# Patient Record
Sex: Female | Born: 1935 | Race: White | Hispanic: No
Health system: Southern US, Community
[De-identification: ages and names within clinical notes are randomized; demographics above are authoritative.]

## PROBLEM LIST (undated history)

## (undated) DIAGNOSIS — F0390 Unspecified dementia without behavioral disturbance: Secondary | ICD-10-CM

## (undated) DIAGNOSIS — E039 Hypothyroidism, unspecified: Secondary | ICD-10-CM

## (undated) DIAGNOSIS — I10 Essential (primary) hypertension: Secondary | ICD-10-CM

## (undated) DIAGNOSIS — E876 Hypokalemia: Secondary | ICD-10-CM

---

## 2006-09-05 ENCOUNTER — Ambulatory Visit: Payer: Self-pay | Admitting: Internal Medicine

## 2007-10-07 ENCOUNTER — Ambulatory Visit: Payer: Self-pay | Admitting: Internal Medicine

## 2009-09-19 ENCOUNTER — Emergency Department: Payer: Self-pay | Admitting: Unknown Physician Specialty

## 2011-06-15 LAB — COMPREHENSIVE METABOLIC PANEL
Alkaline Phosphatase: 86 U/L (ref 50–136)
BUN: 17 mg/dL (ref 7–18)
Bilirubin,Total: 0.4 mg/dL (ref 0.2–1.0)
Calcium, Total: 8.8 mg/dL (ref 8.5–10.1)
Co2: 25 mmol/L (ref 21–32)
Creatinine: 1.11 mg/dL (ref 0.60–1.30)
Glucose: 105 mg/dL — ABNORMAL HIGH (ref 65–99)
SGPT (ALT): 19 U/L
Sodium: 141 mmol/L (ref 136–145)
Total Protein: 8.1 g/dL (ref 6.4–8.2)

## 2011-06-15 LAB — ETHANOL
Ethanol %: <0.003 % (ref 0.000–0.080)
Ethanol: <3 mg/dL

## 2011-06-15 LAB — URINALYSIS, COMPLETE
Bacteria: NONE SEEN
Blood: NEGATIVE
Ketone: NEGATIVE
Nitrite: NEGATIVE
Ph: 6 (ref 4.5–8.0)
Protein: 30
Specific Gravity: 1.01 (ref 1.003–1.030)
WBC UR: 1 /HPF (ref 0–5)

## 2011-06-15 LAB — CBC
HCT: 36.3 % (ref 35.0–47.0)
HGB: 12.3 g/dL (ref 12.0–16.0)
MCH: 32.2 pg (ref 26.0–34.0)
MCV: 95 fL (ref 80–100)
RBC: 3.82 10*6/uL (ref 3.80–5.20)
WBC: 9.2 10*3/uL (ref 3.6–11.0)

## 2011-06-15 LAB — SALICYLATE LEVEL: Salicylates, Serum: <1.7 mg/dL

## 2011-06-15 LAB — CK TOTAL AND CKMB (NOT AT ARMC): CK, Total: 242 U/L — ABNORMAL HIGH (ref 21–215)

## 2011-06-15 LAB — ACETAMINOPHEN LEVEL: Acetaminophen: <2 ug/mL

## 2011-06-16 LAB — CK TOTAL AND CKMB (NOT AT ARMC): CK, Total: 276 U/L — ABNORMAL HIGH (ref 21–215)

## 2011-06-16 LAB — TROPONIN I
Troponin-I: 0.04 ng/mL
Troponin-I: 0.05 ng/mL

## 2011-06-17 LAB — CBC WITH DIFFERENTIAL/PLATELET
Basophil #: 0 10*3/uL (ref 0.0–0.1)
Basophil %: 0.9 %
Eosinophil #: 0.2 10*3/uL (ref 0.0–0.7)
Eosinophil %: 3.3 %
HCT: 32.5 % — ABNORMAL LOW (ref 35.0–47.0)
HGB: 10.9 g/dL — ABNORMAL LOW (ref 12.0–16.0)
Lymphocyte #: 1.2 10*3/uL (ref 1.0–3.6)
Lymphocyte %: 23.3 %
MCH: 32.3 pg (ref 26.0–34.0)
MCHC: 33.7 g/dL (ref 32.0–36.0)
MCV: 96 fL (ref 80–100)
Monocyte #: 0.4 10*3/uL (ref 0.0–0.7)
Monocyte %: 8.2 %
Neutrophil #: 3.4 10*3/uL (ref 1.4–6.5)
Neutrophil %: 64.3 %
Platelet: 119 10*3/uL — ABNORMAL LOW (ref 150–440)
RBC: 3.39 10*6/uL — ABNORMAL LOW (ref 3.80–5.20)
RDW: 14.3 % (ref 11.5–14.5)
WBC: 5.2 10*3/uL (ref 3.6–11.0)

## 2011-06-17 LAB — BASIC METABOLIC PANEL
Anion Gap: 12 (ref 7–16)
BUN: 27 mg/dL — ABNORMAL HIGH (ref 7–18)
Calcium, Total: 8.7 mg/dL (ref 8.5–10.1)
Chloride: 104 mmol/L (ref 98–107)
Co2: 25 mmol/L (ref 21–32)
Creatinine: 1.36 mg/dL — ABNORMAL HIGH (ref 0.60–1.30)
EGFR (African American): 47 — ABNORMAL LOW
EGFR (Non-African Amer.): 39 — ABNORMAL LOW
Glucose: 137 mg/dL — ABNORMAL HIGH (ref 65–99)
Osmolality: 289 (ref 275–301)
Potassium: 3.5 mmol/L (ref 3.5–5.1)
Sodium: 141 mmol/L (ref 136–145)

## 2011-06-17 LAB — URINE CULTURE

## 2011-06-18 ENCOUNTER — Inpatient Hospital Stay: Payer: Self-pay | Admitting: Internal Medicine

## 2011-06-19 LAB — BASIC METABOLIC PANEL
Anion Gap: 14 (ref 7–16)
Calcium, Total: 8.7 mg/dL (ref 8.5–10.1)
Chloride: 103 mmol/L (ref 98–107)
Co2: 25 mmol/L (ref 21–32)
Creatinine: 1.48 mg/dL — ABNORMAL HIGH (ref 0.60–1.30)
EGFR (African American): 43 — ABNORMAL LOW
Glucose: 102 mg/dL — ABNORMAL HIGH (ref 65–99)
Osmolality: 291 (ref 275–301)

## 2011-06-20 LAB — BASIC METABOLIC PANEL
BUN: 22 mg/dL — ABNORMAL HIGH (ref 7–18)
Calcium, Total: 8.2 mg/dL — ABNORMAL LOW (ref 8.5–10.1)
Chloride: 107 mmol/L (ref 98–107)
Creatinine: 1.12 mg/dL (ref 0.60–1.30)
EGFR (Non-African Amer.): 49 — ABNORMAL LOW
Glucose: 147 mg/dL — ABNORMAL HIGH (ref 65–99)
Potassium: 3.4 mmol/L — ABNORMAL LOW (ref 3.5–5.1)
Sodium: 139 mmol/L (ref 136–145)

## 2011-06-21 LAB — BASIC METABOLIC PANEL
BUN: 16 mg/dL (ref 7–18)
Calcium, Total: 8.9 mg/dL (ref 8.5–10.1)
EGFR (African American): >60
EGFR (Non-African Amer.): 53 — ABNORMAL LOW
Glucose: 97 mg/dL (ref 65–99)
Osmolality: 288 (ref 275–301)
Potassium: 3.6 mmol/L (ref 3.5–5.1)
Sodium: 144 mmol/L (ref 136–145)

## 2011-11-18 LAB — URINALYSIS, COMPLETE
Hyaline Cast: 3
Nitrite: NEGATIVE
Ph: 7 (ref 4.5–8.0)
Protein: NEGATIVE
Specific Gravity: 1.012 (ref 1.003–1.030)

## 2011-11-18 LAB — COMPREHENSIVE METABOLIC PANEL
Albumin: 3.3 g/dL — ABNORMAL LOW (ref 3.4–5.0)
Alkaline Phosphatase: 120 U/L (ref 50–136)
Anion Gap: 11 (ref 7–16)
BUN: 21 mg/dL — ABNORMAL HIGH (ref 7–18)
Calcium, Total: 9.4 mg/dL (ref 8.5–10.1)
Glucose: 144 mg/dL — ABNORMAL HIGH (ref 65–99)
Osmolality: 268 (ref 275–301)
SGOT(AST): 33 U/L (ref 15–37)
SGPT (ALT): 21 U/L
Sodium: 131 mmol/L — ABNORMAL LOW (ref 136–145)
Total Protein: 7.6 g/dL (ref 6.4–8.2)

## 2011-11-18 LAB — CBC WITH DIFFERENTIAL/PLATELET
Basophil %: 0.3 %
Eosinophil %: 0.4 %
HGB: 11.7 g/dL — ABNORMAL LOW (ref 12.0–16.0)
Lymphocyte %: 8.3 %
MCH: 31.2 pg (ref 26.0–34.0)
Monocyte #: 0.5 x10 3/mm (ref 0.2–0.9)
Monocyte %: 4.4 %
Neutrophil %: 86.6 %
RBC: 3.77 10*6/uL — ABNORMAL LOW (ref 3.80–5.20)
WBC: 10.8 10*3/uL (ref 3.6–11.0)

## 2011-11-19 ENCOUNTER — Inpatient Hospital Stay: Payer: Self-pay | Admitting: Specialist

## 2011-11-19 LAB — BASIC METABOLIC PANEL
BUN: 19 mg/dL — ABNORMAL HIGH (ref 7–18)
Calcium, Total: 9 mg/dL (ref 8.5–10.1)
Chloride: 99 mmol/L (ref 98–107)
Co2: 25 mmol/L (ref 21–32)
EGFR (Non-African Amer.): 45 — ABNORMAL LOW
Glucose: 111 mg/dL — ABNORMAL HIGH (ref 65–99)
Osmolality: 273 (ref 275–301)
Potassium: 3.4 mmol/L — ABNORMAL LOW (ref 3.5–5.1)
Sodium: 135 mmol/L — ABNORMAL LOW (ref 136–145)

## 2011-11-19 LAB — URIC ACID: Uric Acid: 7.1 mg/dL — ABNORMAL HIGH (ref 2.6–6.0)

## 2011-11-19 LAB — OSMOLALITY, URINE: Osmolality: 283 mOsm/kg

## 2011-11-20 LAB — CBC WITH DIFFERENTIAL/PLATELET
Basophil %: 0.3 %
Eosinophil %: 0.3 %
HCT: 24.9 % — ABNORMAL LOW (ref 35.0–47.0)
HGB: 8.7 g/dL — ABNORMAL LOW (ref 12.0–16.0)
Lymphocyte #: 0.8 10*3/uL — ABNORMAL LOW (ref 1.0–3.6)
Lymphocyte %: 12.5 %
MCH: 31.2 pg (ref 26.0–34.0)
MCV: 89 fL (ref 80–100)
Monocyte %: 10.3 %
Platelet: 199 10*3/uL (ref 150–440)
RBC: 2.8 10*6/uL — ABNORMAL LOW (ref 3.80–5.20)
WBC: 6.3 10*3/uL (ref 3.6–11.0)

## 2011-11-20 LAB — URINE CULTURE

## 2011-11-20 LAB — BASIC METABOLIC PANEL
BUN: 16 mg/dL (ref 7–18)
Calcium, Total: 8 mg/dL — ABNORMAL LOW (ref 8.5–10.1)
Co2: 24 mmol/L (ref 21–32)
EGFR (African American): 54 — ABNORMAL LOW
Sodium: 139 mmol/L (ref 136–145)

## 2011-11-20 LAB — HEMOGLOBIN: HGB: 8.2 g/dL — ABNORMAL LOW (ref 12.0–16.0)

## 2011-11-21 LAB — BASIC METABOLIC PANEL
Calcium, Total: 8.2 mg/dL — ABNORMAL LOW (ref 8.5–10.1)
Co2: 21 mmol/L (ref 21–32)
Creatinine: 1.2 mg/dL (ref 0.60–1.30)
EGFR (Non-African Amer.): 40 — ABNORMAL LOW
Glucose: 97 mg/dL (ref 65–99)
Potassium: 4 mmol/L (ref 3.5–5.1)
Sodium: 143 mmol/L (ref 136–145)

## 2011-11-21 LAB — HEMOGLOBIN: HGB: 8.4 g/dL — ABNORMAL LOW (ref 12.0–16.0)

## 2011-11-21 LAB — HEMOGLOBIN A1C: Hemoglobin A1C: 5.8 % (ref 4.2–6.3)

## 2011-11-22 LAB — BASIC METABOLIC PANEL
Anion Gap: 7 (ref 7–16)
Calcium, Total: 8.4 mg/dL — ABNORMAL LOW (ref 8.5–10.1)
Co2: 24 mmol/L (ref 21–32)
Creatinine: 1.02 mg/dL (ref 0.60–1.30)
EGFR (African American): 57 — ABNORMAL LOW
EGFR (Non-African Amer.): 49 — ABNORMAL LOW
Glucose: 91 mg/dL (ref 65–99)
Osmolality: 276 (ref 275–301)

## 2012-03-01 ENCOUNTER — Emergency Department: Payer: Self-pay | Admitting: Unknown Physician Specialty

## 2012-06-20 ENCOUNTER — Emergency Department: Payer: Self-pay | Admitting: Emergency Medicine

## 2012-06-20 LAB — CBC
HCT: 34.8 % — ABNORMAL LOW (ref 35.0–47.0)
HGB: 11 g/dL — ABNORMAL LOW (ref 12.0–16.0)
MCH: 30 pg (ref 26.0–34.0)
MCHC: 31.7 g/dL — ABNORMAL LOW (ref 32.0–36.0)
MCV: 95 fL (ref 80–100)
RBC: 3.68 10*6/uL — ABNORMAL LOW (ref 3.80–5.20)
RDW: 15.2 % — ABNORMAL HIGH (ref 11.5–14.5)

## 2012-06-20 LAB — BASIC METABOLIC PANEL
Anion Gap: 9 (ref 7–16)
BUN: 25 mg/dL — ABNORMAL HIGH (ref 7–18)
Calcium, Total: 8.9 mg/dL (ref 8.5–10.1)
Chloride: 106 mmol/L (ref 98–107)
Creatinine: 0.83 mg/dL (ref 0.60–1.30)
EGFR (African American): >60
EGFR (Non-African Amer.): >60
Glucose: 102 mg/dL — ABNORMAL HIGH (ref 65–99)
Osmolality: 280 (ref 275–301)
Potassium: 4.7 mmol/L (ref 3.5–5.1)

## 2012-06-20 LAB — URINALYSIS, COMPLETE
Bacteria: NONE SEEN
Bilirubin,UR: NEGATIVE
Blood: NEGATIVE
Glucose,UR: NEGATIVE mg/dL (ref 0–75)
Nitrite: NEGATIVE
Ph: 5 (ref 4.5–8.0)

## 2012-06-20 LAB — TROPONIN I: Troponin-I: <0.02 ng/mL

## 2014-08-10 NOTE — Discharge Summary (Signed)
PATIENT NAMLin Givens:  Diamond [name], Diamond Diamond [name] MR#:  [id number] DATE OF BIRTH:  Jul 24, 1921  DATE OF ADMISSION:  11/19/2011 DATE OF DISCHARGE:  11/22/2011  FINAL DIAGNOSES:  1. Impacted subcapital fracture left hip. 2. Hypertension.  3. Confusion and combativeness with bench coronary artery disease.  4. Hypothyroidism.  5. Hypokalemia.  6. Hyperlipidemia.   PROCEDURE: On 11/19/2011 percutaneous pinning left hip fracture.   COMPLICATIONS: None.   CONSULTATION: [facility].   DISCHARGE MEDICATIONS:  1. Synthroid 0.075 mg daily.  2. Zoloft 25 mg daily.  3. Zocor 40 mg at bedtime.  4. Diovan 80 mg b.i.d.  5. Norco p.r.n. pain.  6. Norvasc 5 mg daily.  7. Enteric-coated aspirin one p.o. b.i.d. for six weeks. 8. Tenormin 25 mg daily.  9. Iron 1 p.o. daily. 10. Risperdal 0.5 mg b.i.d. for agitation.   HISTORY OF PRESENT ILLNESS: The patient is an 81 year old female stays at the [facility] unit of  [facility]. She fell the night prior to admission and injured her left hip. She was brought to the Emergency Room where exam and x-rays revealed an impacted subcapital fracture left hip. She was admitted for medical evaluation and surgery.   PAST MEDICAL HISTORY/ILLNESSES: As above.   HOME MEDICATIONS: As above.   REVIEW OF SYSTEMS: Unremarkable.   SOCIAL HISTORY: Patient is widowed and does not smoke or drink.   FAMILY HISTORY: Hypertension.   PHYSICAL EXAMINATION: Physical examination on admission showed a thin combative female in no distress. Vital signs are normal. The left hip was painful with motion with minimal rotation and shortening. She was tender over the left hip and there was some early bruising. Neurovascular status is good.   LABORATORY, DIAGNOSTIC AND RADIOLOGICAL DATA: Laboratory data on admission was satisfactory.   HOSPITAL COURSE: On 11/19/2011 the patient was taken to surgery under spinal anesthesia had a percutaneous pinning left hip. Postoperatively she did well with no  significant medical problems. She was medicated for agitation and combativeness successfully. Her postoperative hemoglobin was 8.2 on the second postoperative day. Because of her dementia minimal progress with physical therapy can be expected. We need to keep her touchdown weight-bearing only for about six weeks. Physical therapy will work with her and get her up in the chair at least. Rehabilitation potential is fair. Will see her back in my office in two weeks for exam.  ____________________________ Diamond HoarHoward E. Keyira Mondesir, MD hem:cms D: 11/22/2011 13:20:16 ET T: 11/22/2011 13:34:37 ET JOB#: [id number]  cc: Diamond HoarHoward E. Schneider Warchol, MD, <Dictator> Margaretann LovelessNeelam S. Khan, MD  Diamond HoarHOWARD E Analyssa Downs MD ELECTRONICALLY SIGNED 11/26/2011 21:55

## 2014-08-10 NOTE — Discharge Summary (Signed)
PATIENT NAMETORRIE, [name] MR#:  [id number] DATE OF BIRTH:  1922/02/01  DATE OF ADMISSION:  11/19/2011 DATE OF DISCHARGE:  11/22/2011   ADMITTING DIAGNOSIS: Hip fracture.   DISCHARGE DIAGNOSES:  1. Left hip femoral neck fracture status post percutaneous nailing, pinning by Dr. Deeann Saint on 11/19/2011. 2. Hypertension after operation likely dehydration related prior to coming to the hospital.  3. Mild sterile pyuria. Cultures negative.  4. Dementia with agitation, likely delirium.  5. Anemia of chronic disease.  6. Hyponatremia, hypokalemia, resolved on IV fluids as well as supplementation of potassium.  7. Malignant hypertension after rehydration.  8. History of hypothyroidism. 9. Hyperlipidemia. 10. Hypertension. 11. Depression.   DISCHARGE CONDITION: Stable.   DISCHARGE MEDICATIONS: The patient is to resume her outpatient medications which are:  1. Simvastatin 40 mg p.o. at bedtime. 2. Levothyroxine 75 mcg p.o. daily. 3. Aspirin 325 mg p.o. daily. 4. Slow-Mag 2 tablets once daily.  5. Diovan 160 mg p.o. daily.  6. Sertraline 25 mg once daily.  7. Atenolol 25 mg p.o. daily. This is a new dose.  8. Risperdal 0.5 mg p.o. twice daily as needed for agitation.  9. Norvasc 5 mg p.o. daily.  10. Norco 5/325 mg 1 tablet every six hours as needed.  11. Tylenol 650 mg p.o. every four hours as needed.  12. Docusate 240 mg p.o. at bedtime.  13. Iron sulfate 325 mg p.o. daily.  14. Milk of Magnesia 30 mL twice daily as needed.   DO NOT TAKE: The patient is not to take HCTZ.   OXYGEN: None.   DIET: 2 gram salt, low fat, low cholesterol, mechanical soft.   ACTIVITY LIMITATIONS: As tolerated.   REFERRAL: Physical therapy 2 to 7 times a week.   FOLLOW-UP: Follow-up appointment with primary care physician in two days after discharge as well as with Dr. Deeann Saint next week after discharge.   REASON FOR ADMISSION: The patient is an 81 year old Caucasian female with past  medical history significant for history of hypertension, hyperlipidemia, as well as dementia who presented to the hospital with left intertrochanteric fracture. Please refer to Dr. Serita Sheller admission note on 11/19/2011.   PHYSICAL EXAMINATION: On arrival to the hospital, the patient's temperature was 97.8, heart rate was 66, respiratory rate was 18, blood pressure 134/60, saturation 98% on room air. Physical exam showed multiple skin tears bilaterally, also large ecchymosis on the left elbow. Otherwise, physical exam was unremarkable.   LABORATORY, DIAGNOSTIC, AND RADIOLOGICAL DATA: The patient's lab data showed BUN and creatinine of 21 and 1.31, sodium 131, potassium 3.3, glucose 144, otherwise BMP was unremarkable. The patient's uric acid in serum level was elevated at 7.1. The patient's albumin level was 3.3. Liver enzymes were normal. Troponin was normal. White blood cell count was normal at 10.8, hemoglobin 11.7, platelet count 285. Absolute neutrophil count was normal at 9.3. Coagulation panel was unremarkable. The patient's urinalysis revealed yellow cloudy urine, negative for glucose, bilirubin or ketones, specific gravity 1.012, pH 7.0, 1+ blood, negative for protein, nitrites, 3+ leukocytes esterase, 21 red blood cells, 38 white blood cells as well as trace of bacteria, 5 epithelial cells, less than 1 transitional epithelial cell, white blood cell clumps were present. However, the patient's urine culture did not show any growth.   The patient's EKG showed normal sinus rhythm at 68 beats per minute, normal axis, no acute ST-T changes were noted.   Chest x-ray, AP only, 11/18/2011 showed patient had sustained impacted fracture of  the neck of left femur.  Left hip complete x-ray 11/18/2011 showed impacted subcapital fracture of the left hip.   Portable chest x-ray 11/18/2011 showed no evidence of acute cardiopulmonary abnormality.   C-arm with two views of left hip 11/19/2011 after left hip  pinning revealed interval placement of three cannulated screws transfixing a left femoral neck fracture which is in near anatomic alignment. There is no hardware failure or complication according to the radiologist.   CT scan of head without contrast 11/18/2011 revealed no acute abnormality of brain. Mucoperiosteal thickening within the sphenoid and right maxillary sinuses may reflect acute or chronic sinus inflammation and may be impacting the patient's mental status according to the radiologist.   Ultrasound of kidneys, bilateral, 11/19/2011, was not performed.  HOSPITAL COURSE: The patient was admitted to the hospital for further evaluation. Consultation with Dr. Deeann SaintHoward Miller was obtained and the patient underwent operation on 11/19/2011. It was left percutaneous nailing/pinning under spinal anesthesia with minimal estimated blood loss. Postoperatively the patient did well. However, she required some IV fluids because her blood pressure was a little lower. She was rehydrated and her kidney function improved as well as her sodium normalized over a period of time. Her potassium level also was replenished. Her magnesium level which was also found low 1.7 also was replenished. Post rehydration the patient's blood pressure increased and required additional medications for blood pressure control. The patient's atenolol was advanced to 25 mg p.o. daily dose and Norvasc was added, however, HCTZ was discontinued because of hyponatremia as well as hypokalemia. Dr. Deeann SaintHoward Miller recommended the patient to continue aspirin therapy as well as to proceed to physical therapy.   The patient was noted to have mild anemia with rehydration, however, the patient's anemia remained stable. On the day of discharge the patient's hemoglobin level was 8.4. The patient was advised to continue iron supplementation.   For hypertension, the patient's medications as mentioned above were advanced and the patient is to continue blood  pressure medications to be advanced as necessary for better blood pressure control. On day of discharge the patient's temperature was 97.6, pulse 86, respiration rate 16, blood pressure 161/72, saturation 96% on room air at rest.   For hyperlipidemia, the patient is to continue simvastatin.  For hypothyroidism, the patient is to continue Synthroid. It is recommended to follow the patient's TSH as it was not done while she was in the hospital.   The patient is being discharged in stable condition with the above-mentioned medications and follow-up.   Of note, she did have some agitation initially on arrival to the hospital. For this reason, Risperdal was added to her medication regimen.  For depression she is to continue sertraline.   TIME SPENT: 40 minutes.   ____________________________ Katharina Caperima Joliyah Lippens, MD rv:drc D: 11/22/2011 15:04:51 ET T: 11/22/2011 15:56:20 ET JOB#: [id number]  cc: Katharina Caperima Sakiyah Shur, MD, <Dictator> Valinda HoarHoward E. Miller, MD Audery Wassenaar MD ELECTRONICALLY SIGNED 11/26/2011 1:12

## 2014-08-10 NOTE — Consult Note (Signed)
Brief Consult Note: Diagnosis: Left subtrochanteric hip fracture.   Patient was seen by consultant.   Recommend to proceed with surgery or procedure.   Recommend further assessment or treatment.   Orders entered.   Comments: 81 year old female fell at nursing facility yesterday injuring the left hip.  Exam and X-rays at [facility] show an impacted subcapital hip fracture on the left.  Admitted for medical evaluation and surgery.  Will discuss options with daughter as patient has dementia and is very combative.    Exam:  alert but combative.  circulation/sensation/motor function good.  pain with range of motion of hip on left.  skin intact.  no other injury noted.    X-rays : impacted left subcapital hip fracture.  Rx:  Recommend left percutaneous hip pinning.  Have spoken to her daughter on the phone.  Risks and benefits of surgery were discussed at length including but not limited to infection, non union, nerve or blood vessed damage, non union, need for repeat surgery, blood clots and lung emboli, and death.  Electronic Signatures: Valinda HoarMiller, Adisynn Suleiman E (MD)  (Signed 29-Jul-13 12:31)  Authored: Brief Consult Note   Last Updated: 29-Jul-13 12:31 by Valinda HoarMiller, Clarice Bonaventure E (MD)

## 2014-08-10 NOTE — Op Note (Signed)
PATIENT NAME:  Diamond [name], Diamond [name] MR#:  [id number] DATE OF BIRTH:  March 24, 1922  DATE OF PROCEDURE:  11/19/2011  PREOPERATIVE DIAGNOSIS: Impacted subcapital fracture, left hip.   POSTOPERATIVE DIAGNOSIS: Impacted subcapital fracture, left hip.   PROCEDURE PERFORMED: Percutaneous left hip pinning (four Synthes 7.3 mm cannulated screws, long thread).   SURGEON: Valinda HoarHoward E. Shaquetta Arcos, MD   ANESTHESIA: Spinal.   COMPLICATIONS: None.   DRAINS: None.   ESTIMATED BLOOD LOSS: Minimal.   REPLACEMENTS: None.   DESCRIPTION OF PROCEDURE: The patient was brought to the Operating Room where she underwent satisfactory spinal anesthesia and was placed on the fracture table. The right leg was flexed and abducted, and the left leg was placed in minimal traction with internal rotation. Fluoroscopy showed good positioning of the fracture. The hip was prepped and draped in sterile fashion and 4 guidewires were placed percutaneously into the head and neck of the femur under fluoroscopic control. When these were all deemed to be in appropriate position, the cortex was drilled, and four 7.3 mm Synthes cannulated screws were inserted. These measured from 78 to 85 mm in length. The traction was released during this process, and all screws were tightened nicely. Final fluoroscopy showed all screws to be in excellent position in the head and neck. The stab wounds were closed with 3-0 nylon and a dry sterile dressing was applied. The patient was taken out of traction. The hip with good range of motion without crepitus.      She was transferred to her hospital bed and taken to recovery in good condition. ____________________________ Valinda HoarHoward E. Jennifr Gaeta, MD hem:cbb D: 11/19/2011 15:17:45 ET T: 11/19/2011 17:03:24 ET JOB#: [id number]  cc: Valinda HoarHoward E. Aleyda Gindlesperger, MD, <Dictator> Valinda HoarHOWARD E Ariatna Jester MD ELECTRONICALLY SIGNED 11/20/2011 9:29

## 2014-08-10 NOTE — H&P (Signed)
PATIENT NAMLin [name]:  Goldberger, Diamond [name] MR#:  [id number] DATE OF BIRTH:  Apr 26, 1921  DATE OF ADMISSION:  11/19/2011  CHIEF COMPLAINT:  Left intertrochanteric fracture.  HISTORY OF PRESENT ILLNESS:  81 year old female with past medical history of coronary artery disease status post stents in the 1990s, progressive dementia, nursing home resident,  hyperlipidemia, hypertension, and hypothyroidism, who presents with weakness. History is obtained from the Emergency Department  records. The patient apparently has had generalized weakness and has had multiple falls recently at the nursing home. Due to this weakness the patient has been unable to ambulate and she uses a cane and walker for ambulation usually.  She has been increasingly confused as well over the preceding days. However, there have been no fevers, no chest pain or headaches associated with these symptoms.  Due to the difficulty with ambulation and weakness the patient presented to the Emergency Department.   In the Emergency Department hip x-rays revealed a left intertrochanteric fracture so she is being admitted for further care.   PAST MEDICAL HISTORY:  1. Hypertension. 2. Intermittent confusion, due to likely progressive dementia.  3. Coronary artery disease.  4. Hypothyroidism.  5. History of hypokalemia.  6. History of hyperlipidemia.   PAST SURGICAL HISTORY:  1. Hysterectomy in the 1970s.  2. Bilateral cataract surgery in the 1980s.   ALLERGIES: No known drug allergies.   MEDICATIONS:   1. Simvastatin 40 mg nightly.  2. Levothyroxine 75 mcg daily.  3. Valsartan 160 mg daily.  4. Acetaminophen 650 mg every 4 to 6 hours as needed.  5. Hydrochlorothiazide 25 mg daily.  6. Atenolol 12.5 mg daily.  7. Aspirin 81 mg daily.  8. Slow-Mag 2 tabs 2 tablets daily.  9. Sertraline 25 mg daily.    SOCIAL HISTORY: Currently resides at [facility]. No prior history of tobacco, alcohol,  or illicit drug use.   FAMILY HISTORY: Notable for  hypertension.   REVIEW OF SYSTEMS: CONSTITUTIONAL: Notable for weakness. EYES: No change in vision. ENT: Negative for ear pain. RESPIRATORY: Negative for cough. CARDIAC: Negative for chest pain. GI negative for nausea, vomiting, or diarrhea. GU: Unable to obtain history from the patient.  The rest of the review of systems was unable to be obtained due to the patient's mental status. The patient's daughter did not have any additional history other than stated above.   PHYSICAL EXAM:  VITAL SIGNS:  Temperature 97.8, heart rate 66, respirations 18, blood pressure 134/60, sating 98% on room air.   GENERAL: The patient is awake and alert; however, she is confused to time and place. She does know that she [place] in [place], but says she is in [place] visiting her daughter. She does not seem to be aware that she resides in a nursing home in the area.  The patient is agitated, repeatedly stating that she wants us to find her an apartment and she wants to sleep.  HEENT: Normocephalic, atraumatic. Extraocular muscles intact. Anicteric sclerae. Normal external ears and nares. Dry mucous membranes.   NECK: Supple.  No carotid bruit.  CARDIAC: Normal S1 and S2, regular rate and rhythm with a 3/6 systolic murmur.   RESPIRATORY: No respiratory distress. Normal breath sounds bilaterally. No wheezes, rales, or rhonchi.   ABDOMEN:  Soft, nontender, and nondistended with normal bowel sounds.   SKIN: There are multiple skin tears bilaterally. She has a large ecchymosis on the left elbow.   EXTREMITIES: No pedal edema. Normal range of motion in bilateral upper and  lower extremities.  There is pinpoint tenderness over the left greater trochanter. There is no external ecchymosis noted or skin breakdown. No area of erythema.    LABORATORY DATA:  CBC shows WBC count 10.8, hemoglobin 11.7, hematocrit 33.4, platelets 285 with an MCV of 89.   BMP shows glucose 144, BUN 21, creatinine 1.31, increased from 1.05  on last admission in February. Sodium 131, potassium 3.3, sodium is decreased from 144 on the last admission, chloride 95, bicarbonate of 25, calcium 9.4, anion gap of 11.   LFTs showed a total bilirubin 0.9, alkaline phosphatase 120, ALT 21, AST 33, total protein 7.6, albumin 3.3, with an osmolality of 268.   A urinalysis is notable for 3+ leukocyte esterase. No nitrites, no protein, no ketones. There are 21 red blood cells, 38 white blood cells, trace bacteria, 5 epithelial cells, less than one transitional epithelial cell. There are white blood cell clumps present and three hyaline casts.     Troponin is less than 0.02.   INR is 1, activated PTT is 24.5.   CT head shows no acute abnormality of the brain. There is mucoperiosteal thickening within the sphenoid and right maxillary sinuses, which may reflect acute or chronic sinus inflammation and may be impacting the patient's mental status.    Chest x-ray shows no infiltrates, normal heart size and normal mediastinum.   Left hip x-ray shows left intertrochanteric fracture.   EKG shows normal sinus rhythm at 68 beats per minute with normal R-wave progression. No ST-T wave abnormalities.   ASSESSMENT AND PLAN: 81 year old female presenting with weakness, progressive decline in her mental status characterized by agitation, found to have left intertrochanteric fracture, urinary tract infection, acute renal failure, and hyponatremia.   1. Left internal trochanteric fracture: At this time we will manage her pain with pain medications as needed. We will place an orthopedic consult for surgical recommendations. If she is indicated for surgery, she is a low-risk candidate for cardiac morbidity at this time as history does not seem to  support any ongoing cardiac symptoms despite her cardiac history. We will hold her aspirin at this point in time. We will hold her valsartan prior to surgery. This can be resumed postoperatively. Consult physical  therapy 2. Urinary tract infection: Follow up urine cultures. She is status post ceftriaxone and will continue this at this time. We will narrow down antibiotics once urine culture speciates. Does not appear to have further systemic manifestations.  3. Acute renal failure: This is likely due to the urinary tract infection.  She also appears dry. She might be hypovolemic as well due to the urinary tract infection and possibly decreased p.o. intake, although this history is difficult to obtain. We will hydrate her and check a renal ultrasound to rule out any evidence of obstruction given the mild hematuria noted on urinalysis and check urine lytes as well. 4. Hyponatremia: SIADH due to pain versus hypovolemic. Will check magnesium, check urine osmolality and urine sodium. We will correct her sodium level slowly with a goal of  0.5 mEq per hour with normal saline.  5. Hypertension: We will hold her valsartan prior to surgery. We will hold her hydrochlorothiazide while correcting her sodium. We will continue her atenolol and monitor her heart rate given acute renal failure. 6. Coronary artery disease: Continue simvastatin. Hold aspirin prior to surgery.  7. Hypothyroidism: Continue levothyroxine.  8. Prophylaxis: Lovenox, but this will be held prior to surgery.  CODE STATUS: FULL. Multiple attempts to contact daughter  were unsuccesful.  TIME SPENT: 50 mins  ____________________________ Aurther Loft, DO aeo:bjt D: 11/19/2011 02:34:12 ET T: 11/19/2011 09:07:25 ET JOB#: [id number]  cc: Aurther Loft, DO, <Dictator> Illene Labrador. Angie Fava., MD Margaretann Loveless, MD Leeanne Deed, MD Korryn Pancoast E Viann Nielson DO ELECTRONICALLY SIGNED 11/21/2011 6:12

## 2014-08-15 NOTE — Discharge Summary (Signed)
PATIENT NAMLin [name]:  Diamond [name], Diamond [name] MR#:  [id number] DATE OF BIRTH:  Feb 04, 1922  DATE OF ADMISSION:  06/18/2011 DATE OF DISCHARGE:  06/21/2011  DISCHARGE DIAGNOSES:  1. Acute kidney injury.  2. Dementia. 3. Urinary tract infection.  4. Hypomagnesemia.   DISCHARGE MEDICATIONS:  1. Simvastatin, 40 mg, 1 tab orally once a day at bedtime.  2. Levothyroxine, 75 mcg, 1 tab orally once a day first thing in the morning 30 to 60 minutes before eating or taking any other medicines.  3. Valsartan, 160 mg, 1 tab by mouth once daily.  4. Hydrochlorothiazide, 25 mg, 1 tab by mouth once daily.  5. Atenolol, 12.5 mg, 1 tab by mouth once daily.  6. Aspirin, 81 mg, 1 tab by mouth once daily.  7. Slow-Mag, 2 tabs by mouth once daily.   HOSPITAL COURSE: This patient is an 81 year old Caucasian female who presented to the Emergency Room of [facility] on date of admission with chief complaint of confusion and elevated blood pressure. Please see history and physical examination from date of admission for full details regarding her initial triage, evaluation, and treatment. Patient was found to have accelerated hypertension and delirium considered to be secondary to her dehydration and urinary tract infection.   She was admitted to a standard medical bed with treatments appropriate for these conditions, including p.o. antibiotics for her urinary tract infection, IV fluids for acute kidney injury, and the like. Both of these issues did rapidly resolve. Psychiatry was also consulted regarding the patient's delirium. Psychiatry did agree that the patient's delirium was secondary to her acute kidney injury and urinary tract infection. Further corroborating this, the patient's delirium resolved with resolution of her acute kidney injury and urinary tract infection.   Regarding her accelerated hypertension, the patient's blood pressure medications were adjusted while she was in the hospital. She will be  discharged on the adjusted doses of these medications. Her blood pressure should continue to be monitored, and her medications adjusted as needed to maintain parameters consistent with that supported by the geriatric literature regarding blood pressure treatment.   Patient was also noted to be hypomagnesemic during her hospital stay and will be discharged on oral magnesium repletion.   Overall, the patient's hospital course was uncomplicated and she was discharged from the hospital in satisfactory condition.   DIET: Regular diet.   ACTIVITY: As tolerated.   FOLLOW UP: Follow-up visit with Dr. Yves DillNeelam Khan of [facility] in two weeks.   TIME SPENT: Discharge time spent including coordination of care, patient education and the like was greater than 40 minutes.  ____________________________ Burnett HarryMartin G. Shaune SpittleMayer, PA-C, MSPAS, dictating on behalf of Dr. Ellsworth Lennoxejan-Sie mgm:cms D: 06/21/2011 09:29:25 ET T: 06/21/2011 09:39:13 ET JOB#: [id number]  cc: Burnett HarryMartin G. Stacie AcresMayer, PA, <Dictator> Mariane DuvalMARTIN G Elynor Kallenberger PA ELECTRONICALLY SIGNED 06/21/2011 11:10

## 2014-08-15 NOTE — Consult Note (Signed)
Brief Consult Note: Diagnosis: Cognitive d/o NOS, R/O delirium.   Recommend further assessment or treatment.   Comments: Ms. Diamond [name] is fast asleep, no family at bedside, spoke with the sitter. Will return tomorrow.  Electronic Signatures: Kristine LineaPucilowska, Dilan Novosad (MD)  (Signed 25-Feb-13 17:15)  Authored: Brief Consult Note   Last Updated: 25-Feb-13 17:15 by Kristine LineaPucilowska, Krisanne Lich (MD)
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
## 2014-08-15 NOTE — H&P (Signed)
PATIENT NAMLin [name]:  Hafley, Kerrin L MR#:  [id number] DATE OF BIRTH:  05-Jan-1922  DATE OF ADMISSION:  06/15/2011  PRIMARY MD: Dr. Yves DillNeelam Khan.   [facility].   CHIEF COMPLAINT: Worsening confusion, elevated blood pressure.   HISTORY OF PRESENT ILLNESS: The patient is an 81 year old white female who has no diagnosis of dementia but has had, according to her daughter at bedside, has been more and more confused. Does not remember short-term things who is brought to the ED after the patient apparently drank some laundry detergent liquid. Apparently she just drank a little bit of fluid. She was sent to the ED.  In the ER she was noted to have blood pressure elevated in the 200s/100s. The patient was a little agitated earlier but now is improved, according to her daughter. She is currently close to baseline. Her daughter reports that she has had trouble with short-term memory. She does not remember like what she ate or what she did earlier in the day.  She is able to remember things from long period ago. She also gets agitated. Last year she was seen in the ED after she thought that she was putting eye drops in her eyes but it was glue. The patient currently is confused and keeps asking her daughter why she is in the hospital and what she had done wrong. Otherwise, there is no reported history of fevers, chills. No chest pain. No shortness of breath. The patient is unable to give me any review of systems due to her dementia and memory loss.   PAST MEDICAL HISTORY:  1. Coronary artery disease, status post angioplasty in the 1990s.  2. Hypertension.  3. Hyperlipidemia.  4. Hypothyroidism.   PAST SURGICAL HISTORY: Status post hysterectomy in the 3770s and 80s bilateral cataract.   ALLERGIES: None.   CURRENT MEDICATIONS:  She is on:  1. Atenolol 100 daily.  2. Diovan/hydrochlorothiazide 160/25 p.o. daily.  3. Simvastatin 40 daily.   4. Levothyroxine 75 mcg  daily.   SOCIAL HISTORY: No smoking. No alcohol or drug use. [facility].   FAMILY HISTORY: Positive for hypertension according to her daughter.   REVIEW OF SYSTEMS: Not obtainable due to patient's confusion.   PHYSICAL EXAMINATION:   VITAL SIGNS: Temperature 96.4, pulse 98, respirations 32, blood pressure 221/118, O2 of 97%.   GENERAL: The patient currently is in no acute distress. Slight agitation due to her being here.   HEENT: Head atraumatic, normocephalic. Pupils equally round, reactive to light and accommodation. Extraocular movements intact, anicteric sclerae. No conjunctival pallor. Oropharynx is clear without any exudates. Nasal exam shows no ulceration, drainage. Ear exam shows no erythema or swelling.   NECK: There is no thyromegaly. No carotid bruits.   CARDIOVASCULAR: Positive systolic murmur at the left sternal base, left sternal border, 2/6.  No gallops.   LUNGS: No accessory muscle use. Clear to auscultation bilaterally without any rales, rhonchi, or wheezing.   ABDOMEN: Soft, nontender, nondistended. Positive bowel sounds x4. There is no hepatosplenomegaly.   EXTREMITIES: No clubbing, cyanosis, or edema.   NEUROLOGIC: Awake, alert, oriented x3. No focal deficits.   SKIN: There is no rash.   LYMPHATICS: No lymph nodes palpable.   MUSCULOSKELETAL: There is no erythema or swelling.   VASCULAR: Good DP, PT pulses.   NEUROLOGICAL: The patient is not oriented to place or time, oriented to person. Cranial nerves II through XII grossly intact.  Spontaneously  moving all extremities. Strength 5 out of 5 in all four extremities Babinski downgoing.   PSYCHIATRIC: The patient currently a little anxious.   EVALUATIONS:  Chest x-ray shows mild basilar reticular opacity likely secondary to atelectasis. CT scan of the head shows no acute abnormality, chronic vessel changes. WBC 9.2, hemoglobin 12.3. BMP: Glucose was 105, BUN  17, creatinine 1.11, sodium 141, potassium is 3.1. CO2 is 25. CK-MB is 4.5, troponin less than 0.02. INR 0.9. Alcohol less than 0.003. Urinalysis shows 1+ leukocyte esterase.  Urine: WBC is only 1. Salicylate level is less than 2. Acetaminophen level is less than 2.   ASSESSMENT AND PLAN: The patient is an 81 year old white female here with no history of dementia, but based on her symptoms likely has progressive dementia, who today ingested laundry detergent, which was only a few sips, noted to have accelerated hypertension, some confusion.  1. Accelerated hypertension. Could be related to mild agitation. At this time we will continue atenolol, Diovan/hydrochlorothiazide. I will start her on clonidine b.i.d. and then we will do p.r.n. hydralazine.  2. Confusion, likely related to dementia with some worsening due to hypertensive encephalopathy.  3. Possible urinary tract infection based on leukocytes in her urine but there is one WBC.  I will go ahead and check a urine culture. Will place her on oral Cipro.  4. Hypokalemia likely due to hydrochlorothiazide therapy.  We will replace her potassium.  5. Hyperlipidemia. Continue simvastatin as taking at home.  6. Coronary artery disease.  I will place her on aspirin.  7. Hypothyroidism. Continue levothyroxine.  We will check a TSH.    DISPOSITION: The patient currently lives in independent living.  I will have case manager come speak to the family regarding decision for possible higher level of care due to progressive dementia.    TIME SPENT:  35 minutes.     ____________________________ Lacie Scotts Allena Katz, MD shp:vtd D: 06/15/2011 19:48:14 ET T: 06/16/2011 06:38:51 ET JOB#: [id number]  cc: Chancy Claros H. Allena Katz, MD, <Dictator> Margaretann Loveless, MD Charise Carwin MD ELECTRONICALLY SIGNED 06/29/2011 7:52

## 2015-09-05 ENCOUNTER — Encounter: Payer: Self-pay | Admitting: *Deleted

## 2015-09-05 ENCOUNTER — Emergency Department: Payer: Medicare Other

## 2015-09-05 ENCOUNTER — Emergency Department
Admission: EM | Admit: 2015-09-05 | Discharge: 2015-09-05 | Disposition: A | Discharge status: Home / Self Care | Payer: Medicare Other | Attending: Emergency Medicine | Admitting: Emergency Medicine

## 2015-09-05 DIAGNOSIS — W19XXXA Unspecified fall, initial encounter: Secondary | ICD-10-CM

## 2015-09-05 DIAGNOSIS — E039 Hypothyroidism, unspecified: Secondary | ICD-10-CM | POA: Diagnosis not present

## 2015-09-05 DIAGNOSIS — W500XXA Accidental hit or strike by another person, initial encounter: Secondary | ICD-10-CM | POA: Diagnosis not present

## 2015-09-05 DIAGNOSIS — I10 Essential (primary) hypertension: Secondary | ICD-10-CM | POA: Insufficient documentation

## 2015-09-05 DIAGNOSIS — Y939 Activity, unspecified: Secondary | ICD-10-CM | POA: Insufficient documentation

## 2015-09-05 DIAGNOSIS — Y92239 Unspecified place in hospital as the place of occurrence of the external cause: Secondary | ICD-10-CM | POA: Insufficient documentation

## 2015-09-05 DIAGNOSIS — Y999 Unspecified external cause status: Secondary | ICD-10-CM | POA: Diagnosis not present

## 2015-09-05 DIAGNOSIS — S60211A Contusion of right wrist, initial encounter: Secondary | ICD-10-CM | POA: Diagnosis not present

## 2015-09-05 DIAGNOSIS — S6991XA Unspecified injury of right wrist, hand and finger(s), initial encounter: Secondary | ICD-10-CM | POA: Diagnosis present

## 2015-09-05 HISTORY — DX: Essential (primary) hypertension: I10

## 2015-09-05 HISTORY — DX: Hypokalemia: E87.6

## 2015-09-05 HISTORY — DX: Unspecified dementia, unspecified severity, without behavioral disturbance, psychotic disturbance, mood disturbance, and anxiety: F03.90

## 2015-09-05 HISTORY — DX: Hypothyroidism, unspecified: E03.9

## 2015-09-05 MED ORDER — LORAZEPAM 2 MG/ML IJ SOLN
1.0000 mg | Freq: Once | INTRAMUSCULAR | Status: AC
  Administered 2015-09-05: 1 mg via INTRAMUSCULAR
  Filled 2015-09-05: qty 1
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
## 2015-09-05 NOTE — ED Notes (Addendum)
Pt resting in bed, safety sitter at bedside, per [facility] at [facility], [facility]
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
## 2015-09-05 NOTE — ED Notes (Signed)
EMS called for transport.

## 2015-09-05 NOTE — Discharge Instructions (Signed)
Contusion A contusion is a deep bruise. Contusions are the result of a blunt injury to tissues and muscle fibers under the skin. The injury causes bleeding under the skin. The skin overlying the contusion may turn blue, purple, or yellow. Minor injuries will give you a painless contusion, but more severe contusions may stay painful and swollen for a few weeks.  CAUSES  This condition is usually caused by a blow, trauma, or direct force to an area of the body. SYMPTOMS  Symptoms of this condition include:  Swelling of the injured area.  Pain and tenderness in the injured area.  Discoloration. The area may have redness and then turn blue, purple, or yellow. DIAGNOSIS  This condition is diagnosed based on a physical exam and medical history. An X-ray, CT scan, or MRI may be needed to determine if there are any associated injuries, such as broken bones (fractures). TREATMENT  Specific treatment for this condition depends on what area of the body was injured. In general, the best treatment for a contusion is resting, icing, applying pressure to (compression), and elevating the injured area. This is often called the RICE strategy. Over-the-counter anti-inflammatory medicines may also be recommended for pain control.  HOME CARE INSTRUCTIONS   Rest the injured area.  If directed, apply ice to the injured area:  Put ice in a plastic bag.  Place a towel between your skin and the bag.  Leave the ice on for 20 minutes, 2-3 times per day.  If directed, apply light compression to the injured area using an elastic bandage. Make sure the bandage is not wrapped too tightly. Remove and reapply the bandage as directed by your health care provider.  If possible, raise (elevate) the injured area above the level of your heart while you are sitting or lying down.  Take over-the-counter and prescription medicines only as told by your health care provider. SEEK MEDICAL CARE IF:  Your symptoms do not  improve after several days of treatment.  Your symptoms get worse.  You have difficulty moving the injured area. SEEK IMMEDIATE MEDICAL CARE IF:   You have severe pain.  You have numbness in a hand or foot.  Your hand or foot turns pale or cold.   This information is not intended to replace advice given to you by your health care provider. Make sure you discuss any questions you have with your health care provider.   Document Released: 01/17/2005 Document Revised: 12/29/2014 Document Reviewed: 08/25/2014 Elsevier Interactive Patient Education 2016 [employer].  Fall Prevention in nursing homes, Adult As a hospital patient, your condition and the treatments you receive can increase your risk for falls. Some additional risk factors for falls in a hospital include:  Being in an unfamiliar environment.  Being on bed rest.  Your surgery.  Taking certain medicines.  Your tubing requirements, such as intravenous (IV) therapy or catheters. It is important that you learn how to decrease fall risks while at the hospital. Below are important tips that can help prevent falls. SAFETY TIPS FOR PREVENTING FALLS Talk about your risk of falling.  Ask your health care provider why you are at risk for falling. Is it your medicine, illness, tubing placement, or something else?  Make a plan with your health care provider to keep you safe from falls.  Ask your health care provider or pharmacist about side effects of your medicines. Some medicines can make you dizzy or affect your coordination. Ask for help.  Ask for help before  getting out of bed. You may need to press your call button.  Ask for assistance in getting safely to the toilet.  Ask for a walker or cane to be put at your bedside. Ask that most of the side rails on your bed be placed up before your health care provider leaves the room.  Ask family or friends to sit with you.  Ask for things that are out of your reach, such as  your glasses, hearing aids, telephone, bedside table, or call button. Follow these tips to avoid falling:  Stay lying or seated, rather than standing, while waiting for help.  Wear rubber-soled slippers or shoes whenever you walk in the hospital.  Avoid quick, sudden movements.  Change positions slowly.  Sit on the side of your bed before standing.  Stand up slowly and wait before you start to walk.  Let your health care provider know if there is a spill on the floor.  Pay careful attention to the medical equipment, electrical cords, and tubes around you.  When you need help, use your call button by your bed or in the bathroom. Wait for one of your health care providers to help you.  If you feel dizzy or unsure of your footing, return to bed and wait for assistance.  Avoid being distracted by the TV, telephone, or another person in your room.  Do not lean or support yourself on rolling objects, such as IV poles or bedside tables.   This information is not intended to replace advice given to you by your health care provider. Make sure you discuss any questions you have with your health care provider.   Document Released: 04/06/2000 Document Revised: 04/30/2014 Document Reviewed: 12/16/2011 Elsevier Interactive Patient Education [employer] Elsevier Inc.

## 2015-09-05 NOTE — ED Notes (Signed)
EMs at bedside, pt transported back to [facility]

## 2016-04-01 ENCOUNTER — Emergency Department: Payer: Medicare Other

## 2016-04-01 ENCOUNTER — Emergency Department
Admission: EM | Admit: 2016-04-01 | Discharge: 2016-04-01 | Disposition: A | Discharge status: Home / Self Care | Payer: Medicare Other | Attending: Emergency Medicine | Admitting: Emergency Medicine

## 2016-04-01 DIAGNOSIS — W1839XA Other fall on same level, initial encounter: Secondary | ICD-10-CM | POA: Insufficient documentation

## 2016-04-01 DIAGNOSIS — W19XXXA Unspecified fall, initial encounter: Secondary | ICD-10-CM

## 2016-04-01 DIAGNOSIS — Y999 Unspecified external cause status: Secondary | ICD-10-CM | POA: Insufficient documentation

## 2016-04-01 DIAGNOSIS — Y939 Activity, unspecified: Secondary | ICD-10-CM | POA: Insufficient documentation

## 2016-04-01 DIAGNOSIS — Y92129 Unspecified place in nursing home as the place of occurrence of the external cause: Secondary | ICD-10-CM | POA: Diagnosis not present

## 2016-04-01 DIAGNOSIS — I10 Essential (primary) hypertension: Secondary | ICD-10-CM | POA: Diagnosis not present

## 2016-04-01 DIAGNOSIS — E039 Hypothyroidism, unspecified: Secondary | ICD-10-CM | POA: Insufficient documentation

## 2016-04-01 DIAGNOSIS — S0101XA Laceration without foreign body of scalp, initial encounter: Secondary | ICD-10-CM | POA: Insufficient documentation

## 2016-04-01 DIAGNOSIS — S0990XA Unspecified injury of head, initial encounter: Secondary | ICD-10-CM | POA: Diagnosis present

## 2016-04-01 NOTE — ED Triage Notes (Signed)
Pt presents from [facility] via [facility] for a fall in her room. EMS states pt fell against a dresser. Hematoma noted to R head, old bruising and swelling noted to both hands. Pt speaks broken english and Mic[place]agerman. Does NOT like BP cuff. Does NOT want to be here, upset she is here

## 2016-04-01 NOTE — ED Notes (Signed)
Warm blankets given and lights dimmed for comfort.

## 2016-04-01 NOTE — ED Notes (Signed)
Pt lying on L side on stretcher. Lights dimmed for comfort.

## 2016-04-01 NOTE — ED Notes (Signed)
Pt taken to CT via stretcher.

## 2016-04-01 NOTE — ED Provider Notes (Signed)
[facility] Emergency Department Provider Note      5 caveat: Review of systems and history is limited by dementia.  Time seen: ----------------------------------------- 8:13 PM on 04/01/2016 -----------------------------------------    I have reviewed the triage vital signs and the nursing notes.   HISTORY  Chief Complaint Fall    HPI Lin Givenselly L Inga is a 82 y.o. female who presents to the ER after a fall in her room at the nursing home. EMS states the patient fell against a dresser. Hematoma was noted to her right side of her forehead. She was noted to have bruising and swelling in both hands. She denies any complaints   Past Medical History:  Diagnosis Date  . Dementia   . Hypertension   . Hypothyroid   . Low blood potassium     There are no active problems to display for this patient.   History reviewed. No pertinent surgical history.  Allergies Patient has no known allergies.  Social History Social History  Substance Use Topics  . Smoking status: Unknown If Ever Smoked  . Smokeless tobacco: Not on file  . Alcohol use Not on file    Review of Systems Unknown, patient denies complaints.  ____________________________________________   PHYSICAL EXAM:  VITAL SIGNS: ED Triage Vitals [04/01/16 2002]  Enc Vitals Group     BP (!) 153/90     Pulse Rate 75     Resp 20     Temp 98.2 F (36.8 C)     Temp Source Oral     SpO2 95 %     Weight      Height      Head Circumference      Peak Flow      Pain Score      Pain Loc      Pain Edu?      Excl. in GC?    Constitutional: Alert But disoriented, Well appearing and in no distress. Eyes: Conjunctivae are normal. Normal extraocular movements. ENT   Head: Right fronto/parietal scalp contusion   Nose: No congestion/rhinnorhea.   Mouth/Throat: Mucous membranes are moist.   Neck: No stridor. Cardiovascular: Normal rate, regular rhythm. Systolic murmur Respiratory:  Normal respiratory effort without tachypnea nor retractions. Breath sounds are clear and equal bilaterally. No wheezes/rales/rhonchi. Musculoskeletal: Nontender with normal range of motion in all extremities.  Neurologic:  No gross focal neurologic deficits are appreciated.  Skin:  Scalp contusion. Scattered contusions on the extremities  ED COURSE:  Pertinent labs & imaging results that were available during my care of the patient were reviewed by me and considered in my medical decision making (see chart for details). Clinical Course   Patient is in no distress, we will assess with imaging.  Procedures ____________________________________________   RADIOLOGY Images were viewed by me  IMPRESSION: No acute intracranial hemorrhage.  Moderate age-related atrophy and chronic microvascular ischemic changes.  Focal skin prominence over the right temporal calvarium. Correlation with clinical exam recommended.  ____________________________________________  FINAL ASSESSMENT AND PLAN  Fall, minor head injury  Plan: Patient with imaging as dictated above. Patient is in no distress, CT is negative. She is stable for outpatient follow-up.   Emily FilbertWilliams, Jonathan E, MD   Note: This dictation was prepared with Dragon dictation. Any transcriptional errors that result from this process are unintentional    Emily FilbertJonathan E Williams, MD 04/01/16 2214

## 2016-05-01 ENCOUNTER — Emergency Department: Payer: Medicare Other

## 2016-05-01 ENCOUNTER — Emergency Department
Admission: EM | Admit: 2016-05-01 | Discharge: 2016-05-01 | Disposition: A | Discharge status: Home / Self Care | Payer: Medicare Other | Attending: Emergency Medicine | Admitting: Emergency Medicine

## 2016-05-01 DIAGNOSIS — F0390 Unspecified dementia without behavioral disturbance: Secondary | ICD-10-CM | POA: Diagnosis not present

## 2016-05-01 DIAGNOSIS — Y92129 Unspecified place in nursing home as the place of occurrence of the external cause: Secondary | ICD-10-CM | POA: Diagnosis not present

## 2016-05-01 DIAGNOSIS — E039 Hypothyroidism, unspecified: Secondary | ICD-10-CM | POA: Insufficient documentation

## 2016-05-01 DIAGNOSIS — Y999 Unspecified external cause status: Secondary | ICD-10-CM | POA: Insufficient documentation

## 2016-05-01 DIAGNOSIS — Z79899 Other long term (current) drug therapy: Secondary | ICD-10-CM | POA: Insufficient documentation

## 2016-05-01 DIAGNOSIS — I10 Essential (primary) hypertension: Secondary | ICD-10-CM | POA: Insufficient documentation

## 2016-05-01 DIAGNOSIS — W1839XA Other fall on same level, initial encounter: Secondary | ICD-10-CM | POA: Diagnosis not present

## 2016-05-01 DIAGNOSIS — Y939 Activity, unspecified: Secondary | ICD-10-CM | POA: Insufficient documentation

## 2016-05-01 DIAGNOSIS — Z7982 Long term (current) use of aspirin: Secondary | ICD-10-CM | POA: Diagnosis not present

## 2016-05-01 DIAGNOSIS — W19XXXA Unspecified fall, initial encounter: Secondary | ICD-10-CM

## 2016-05-01 DIAGNOSIS — S8001XA Contusion of right knee, initial encounter: Secondary | ICD-10-CM | POA: Diagnosis not present

## 2016-05-01 DIAGNOSIS — S8991XA Unspecified injury of right lower leg, initial encounter: Secondary | ICD-10-CM | POA: Diagnosis present

## 2016-05-01 LAB — CBC WITH DIFFERENTIAL/PLATELET
BASOS ABS: 0 10*3/uL (ref 0–0.1)
Basophils Relative: 1 %
EOS PCT: 0 %
Eosinophils Absolute: 0 10*3/uL (ref 0–0.7)
HCT: 27 % — ABNORMAL LOW (ref 35.0–47.0)
Hemoglobin: 8.9 g/dL — ABNORMAL LOW (ref 12.0–16.0)
LYMPHS PCT: 8 %
Lymphs Abs: 0.3 10*3/uL — ABNORMAL LOW (ref 1.0–3.6)
MCH: 29.9 pg (ref 26.0–34.0)
MCHC: 32.9 g/dL (ref 32.0–36.0)
MCV: 91 fL (ref 80.0–100.0)
MONO ABS: 0.5 10*3/uL (ref 0.2–0.9)
MONOS PCT: 13 %
Neutro Abs: 3.3 10*3/uL (ref 1.4–6.5)
Neutrophils Relative %: 78 %
PLATELETS: 173 10*3/uL (ref 150–440)
RBC: 2.96 MIL/uL — ABNORMAL LOW (ref 3.80–5.20)
RDW: 17.2 % — AB (ref 11.5–14.5)
WBC: 4.2 10*3/uL (ref 3.6–11.0)

## 2016-05-01 LAB — COMPREHENSIVE METABOLIC PANEL
ALBUMIN: 3.4 g/dL — AB (ref 3.5–5.0)
ALK PHOS: 70 U/L (ref 38–126)
ALT: 12 U/L — ABNORMAL LOW (ref 14–54)
AST: 20 U/L (ref 15–41)
Anion gap: 10 (ref 5–15)
BILIRUBIN TOTAL: 0.5 mg/dL (ref 0.3–1.2)
BUN: 26 mg/dL — AB (ref 6–20)
CO2: 29 mmol/L (ref 22–32)
Calcium: 8.7 mg/dL — ABNORMAL LOW (ref 8.9–10.3)
Chloride: 96 mmol/L — ABNORMAL LOW (ref 101–111)
Creatinine, Ser: 1.13 mg/dL — ABNORMAL HIGH (ref 0.44–1.00)
GFR calc Af Amer: 47 mL/min — ABNORMAL LOW (ref >60)
GFR calc non Af Amer: 41 mL/min — ABNORMAL LOW (ref >60)
GLUCOSE: 107 mg/dL — AB (ref 65–99)
POTASSIUM: 3.8 mmol/L (ref 3.5–5.1)
Sodium: 135 mmol/L (ref 135–145)
TOTAL PROTEIN: 7.3 g/dL (ref 6.5–8.1)

## 2016-05-01 LAB — LACTIC ACID, PLASMA: Lactic Acid, Venous: 1.1 mmol/L (ref 0.5–1.9)

## 2016-05-01 LAB — VALPROIC ACID LEVEL: VALPROIC ACID LVL: 32 ug/mL — AB (ref 50.0–100.0)

## 2016-05-01 LAB — TROPONIN I: Troponin I: <0.03 ng/mL (ref <0.03)

## 2016-05-01 MED ORDER — SODIUM CHLORIDE 0.9 % IV BOLUS (SEPSIS)
1000.0000 mL | Freq: Once | INTRAVENOUS | Status: AC
  Administered 2016-05-01: 1000 mL via INTRAVENOUS

## 2016-05-01 NOTE — ED Notes (Signed)
Pt refused to have vital signs taken. Pt unable to sign for DC d/t dementia

## 2016-05-01 NOTE — Discharge Instructions (Signed)
There is no pneumonia on chest xray in the ED.   Fall precautions.   See your doctor  Return to ER if she has another fall, passing out, fevers, vomiting, cough, trouble breathing.

## 2016-05-01 NOTE — ED Notes (Signed)
Judeth CornfieldStephanie, NT sitting at bedside for pt safety
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
## 2016-05-01 NOTE — ED Notes (Signed)
Pt sleeping on stretcher, blankets covering pt. Pt placed in hallway bed until DC

## 2016-05-01 NOTE — ED Provider Notes (Signed)
[facility]-EMERGENCY DEPARTMENT Provider Note   CSN: [id number] Arrival date & time: 05/01/16  1130     History   Chief Complaint Chief Complaint  Patient presents with  . Fall    HPI Diamond [name] is a 83 y.o. female hx of dementia, HTN, hypothyroidism, Here presenting with unwitnessed fall, cough. Patient is from a nursing home. She had a witnessed fall and was found on the floor by the staff. Patient also was noted to have temp 100.4 F at the facility and had some cough. Her roommate was recently diagnosed with pneumonia so they wanted her to be checked in the ED. Patient has no complaints and couldn't tell me how she fell.   The history is provided by the patient and the EMS personnel.   Level v caveat- dementia   Past Medical History:  Diagnosis Date  . Dementia   . Hypertension   . Hypothyroid   . Low blood potassium     There are no active problems to display for this patient.   History reviewed. No pertinent surgical history.  OB History    No data available       Home Medications    Prior to Admission medications   Not on File    Family History History reviewed. No pertinent family history.  Social History Social History  Substance Use Topics  . Smoking status: Unknown If Ever Smoked  . Smokeless tobacco: Not on file  . Alcohol use Not on file     Allergies   Patient has no known allergies.   Review of Systems Review of Systems  Unable to perform ROS: Dementia  All other systems reviewed and are negative.    Physical Exam Updated Vital Signs BP (!) 144/85 (BP Location: Right Arm)   Pulse 80   Temp 98 F (36.7 C)   Resp 20   SpO2 96%   Physical Exam  Constitutional:  Demented   HENT:  Head: Normocephalic and atraumatic.  Mouth/Throat: Oropharynx is clear and moist.  Eyes: EOM are normal. Pupils are equal, round, and reactive to light.  Neck: Normal range of motion. Neck supple.  Cardiovascular: Normal rate, regular rhythm and  normal heart sounds.   Pulmonary/Chest: Effort normal.  Diminished bilateral bases, no obvious crackles or wheezing   Abdominal: Soft. Bowel sounds are normal. She exhibits no distension. There is no tenderness. There is no guarding.  Musculoskeletal: Normal range of motion.  Nl ROM bilateral hips. Bruising R knee, no obvious deformity   Neurological: She is alert.  Skin: Skin is warm.  Psychiatric: She has a normal mood and affect.  Nursing note and vitals reviewed.    ED Treatments / Results  Labs (all labs ordered are listed, but only abnormal results are displayed) Labs Reviewed  CULTURE, BLOOD (ROUTINE X 2)  CULTURE, BLOOD (ROUTINE X 2)  URINE CULTURE  COMPREHENSIVE METABOLIC PANEL  CBC WITH DIFFERENTIAL/PLATELET  LACTIC ACID, PLASMA  LACTIC ACID, PLASMA  URINALYSIS, COMPLETE (UACMP) WITH MICROSCOPIC  TROPONIN I  VALPROIC ACID LEVEL    EKG  EKG Interpretation None      ED ECG REPORT I, Richardean Canal, the attending physician, personally viewed and interpreted this ECG.   Date: 05/01/2016  EKG Time: 11:38 am  Rate: 71  Rhythm: normal EKG, normal sinus rhythm  Axis: normal  Intervals:none  ST&T Change: nonspecific    Radiology Dg Chest 2 View  Result Date: 05/01/2016 CLINICAL DATA:  Unwitnessed fall by transferring from  bed to chair. EXAM: CHEST  2 VIEW COMPARISON:  PA and lateral chest x-ray of Sep 05, 2015 FINDINGS: The lungs are adequately inflated. The interstitial markings are coarse though stable. There is no alveolar infiltrate. There is no pleural effusion or pneumothorax. The heart and pulmonary vascularity are normal. There is calcification in the wall of the aortic arch. The trachea is midline. The bony thorax exhibits no acute abnormality. IMPRESSION: Mild chronic bronchitic changes.  No alveolar pneumonia nor CHF. Thoracic aortic atherosclerosis. Electronically Signed   By: [name]  [place] M.D.   On: 05/01/2016 12:30   Dg Pelvis 1-2 Views  Result Date:  05/01/2016 CLINICAL DATA:  Unwitnessed fall. EXAM: PELVIS - 1-2 VIEW COMPARISON:  09/05/2015 . FINDINGS: Degenerative changes lumbar spine and right hip. Surgical screws left hip. Diffuse osteopenia degenerative change. Pelvic calcifications system phleboliths. Peripheral vascular calcification . IMPRESSION: 1. Diffuse osteopenia and degenerative change. Postsurgical changes left hip. No acute bony abnormality. 2. Peripheral vascular disease. Electronically Signed   By: Maisie Fus  Register   On: 05/01/2016 12:30   Ct Head Wo Contrast  Result Date: 05/01/2016 CLINICAL DATA:  Recent fall EXAM: CT HEAD WITHOUT CONTRAST CT CERVICAL SPINE WITHOUT CONTRAST TECHNIQUE: Multidetector CT imaging of the head and cervical spine was performed following the standard protocol without intravenous contrast. Multiplanar CT image reconstructions of the cervical spine were also generated. COMPARISON:  04/01/2016 FINDINGS: CT HEAD FINDINGS Brain: Diffuse atrophic changes and chronic white matter ischemic change is noted. No findings to suggest acute hemorrhage, acute infarction or space-occupying mass lesion are seen. Vascular: No hyperdense vessel or unexpected calcification. Skull: Normal. Negative for fracture or focal lesion. Sinuses/Orbits: No acute finding. Other: None. CT CERVICAL SPINE FINDINGS Alignment: Slight anterolisthesis of C2 on C3 is noted of a degenerative nature. Skull base and vertebrae: 7 cervical segments are well visualized. Vertebral body height is well maintained. Facet hypertrophic changes and osteophytic changes are noted from C3 to C7. Mild chronic compression deformity of T1 is noted. No acute fracture or acute facet abnormality is seen. Soft tissues and spinal canal: Within normal limits. Disc levels: Multilevel disc space narrowing with osteophytic changes are noted. Upper chest: Negative. IMPRESSION: CT of the head: Chronic atrophic and ischemic changes without acute abnormality. CT of the cervical spine:  Degenerative change as described without acute abnormality Electronically Signed   By: Alcide Clever M.D.   On: 05/01/2016 12:08   Ct Cervical Spine Wo Contrast  Result Date: 05/01/2016 CLINICAL DATA:  Recent fall EXAM: CT HEAD WITHOUT CONTRAST CT CERVICAL SPINE WITHOUT CONTRAST TECHNIQUE: Multidetector CT imaging of the head and cervical spine was performed following the standard protocol without intravenous contrast. Multiplanar CT image reconstructions of the cervical spine were also generated. COMPARISON:  04/01/2016 FINDINGS: CT HEAD FINDINGS Brain: Diffuse atrophic changes and chronic white matter ischemic change is noted. No findings to suggest acute hemorrhage, acute infarction or space-occupying mass lesion are seen. Vascular: No hyperdense vessel or unexpected calcification. Skull: Normal. Negative for fracture or focal lesion. Sinuses/Orbits: No acute finding. Other: None. CT CERVICAL SPINE FINDINGS Alignment: Slight anterolisthesis of C2 on C3 is noted of a degenerative nature. Skull base and vertebrae: 7 cervical segments are well visualized. Vertebral body height is well maintained. Facet hypertrophic changes and osteophytic changes are noted from C3 to C7. Mild chronic compression deformity of T1 is noted. No acute fracture or acute facet abnormality is seen. Soft tissues and spinal canal: Within normal limits. Disc levels: Multilevel disc space narrowing  with osteophytic changes are noted. Upper chest: Negative. IMPRESSION: CT of the head: Chronic atrophic and ischemic changes without acute abnormality. CT of the cervical spine: Degenerative change as described without acute abnormality Electronically Signed   By: Alcide CleverMark  Lukens M.D.   On: 05/01/2016 12:08   Dg Knee Complete 4 Views Right  Result Date: 05/01/2016 CLINICAL DATA:  83 year old female status post unwitnessed fall while transferring from bed to chair EXAM: RIGHT KNEE - COMPLETE 4+ VIEW COMPARISON:  None. FINDINGS: No evidence of acute  fracture, malalignment or knee joint effusion. Chondrocalcinosis is present in both the medial and lateral compartments. Normal bony mineralization. No lytic or blastic osseous lesion. Mild atherosclerotic calcifications in the visualized arteries. No focal soft tissue abnormality. IMPRESSION: 1. No acute fracture, malalignment or knee joint effusion. 2. Chondrocalcinosis. 3. Atherosclerotic vascular calcifications. Electronically Signed   By: Malachy MoanHeath  McCullough M.D.   On: 05/01/2016 12:31    Procedures Procedures (including critical care time)  Medications Ordered in ED Medications  sodium chloride 0.9 % bolus 1,000 mL (not administered)     Initial Impression / Assessment and Plan / ED Course  I have reviewed the triage vital signs and the nursing notes.  Pertinent labs & imaging results that were available during my care of the patient were reviewed by me and considered in my medical decision making (see chart for details).  Clinical Course     Lin Givenselly L Connors is a 83 y.o. female here with fall. Unwitnessed fall, hx of dementia. Also had low grade temp at facility but afebrile in the ED. Will get sepsis workup, labs, CT head/neck, CXR, UA.   2:35 PM WBC nl. Hg 8.9, no baseline. CXR and lactate nl. CT head/neck unremarkable. Refused to give UA and refused in and out cath. No fever here. Doesn't appear septic. Back to baseline dementia. Will dc back to facility.   Final Clinical Impressions(s) / ED Diagnoses   Final diagnoses:  None    New Prescriptions New Prescriptions   No medications on file     Charlynne Panderavid Hsienta Yao, MD 05/01/16 1436

## 2016-05-01 NOTE — ED Notes (Signed)
Pt will not stay in bed; charge nurse notified and is trying to get a sitter. Pt refuses to allow catheterization or to give urine specimen by voiding.

## 2016-05-01 NOTE — ED Notes (Signed)
Pt got up and pulled out her IV and removed all cords. Pt found standing almost naked in room, surrounded by blood and IV fluids. Pt cleaned up and linens changed. Per Dr Silverio LayYao, no need to re-do IV at this time. Bed alarm placed on pt and light left on to see if darkness contributes to pt's restlessness.

## 2016-05-01 NOTE — ED Notes (Signed)
Pt taken to CT via stretcher.

## 2016-05-06 LAB — CULTURE, BLOOD (ROUTINE X 2): CULTURE: NO GROWTH

## 2016-05-10 ENCOUNTER — Emergency Department: Payer: Medicare Other

## 2016-05-10 ENCOUNTER — Encounter: Payer: Self-pay | Admitting: Emergency Medicine

## 2016-05-10 ENCOUNTER — Inpatient Hospital Stay
Admission: EM | Admit: 2016-05-10 | Discharge: 2016-05-24 | DRG: 872 | Disposition: E | Payer: Medicare Other | Attending: Internal Medicine | Admitting: Internal Medicine

## 2016-05-10 DIAGNOSIS — A419 Sepsis, unspecified organism: Secondary | ICD-10-CM | POA: Diagnosis not present

## 2016-05-10 DIAGNOSIS — F0390 Unspecified dementia without behavioral disturbance: Secondary | ICD-10-CM | POA: Diagnosis present

## 2016-05-10 DIAGNOSIS — Z79899 Other long term (current) drug therapy: Secondary | ICD-10-CM

## 2016-05-10 DIAGNOSIS — R197 Diarrhea, unspecified: Secondary | ICD-10-CM | POA: Diagnosis present

## 2016-05-10 DIAGNOSIS — I110 Hypertensive heart disease with heart failure: Secondary | ICD-10-CM | POA: Diagnosis present

## 2016-05-10 DIAGNOSIS — E039 Hypothyroidism, unspecified: Secondary | ICD-10-CM | POA: Diagnosis present

## 2016-05-10 DIAGNOSIS — R7989 Other specified abnormal findings of blood chemistry: Secondary | ICD-10-CM

## 2016-05-10 DIAGNOSIS — I4891 Unspecified atrial fibrillation: Secondary | ICD-10-CM | POA: Diagnosis present

## 2016-05-10 DIAGNOSIS — I248 Other forms of acute ischemic heart disease: Secondary | ICD-10-CM | POA: Diagnosis present

## 2016-05-10 DIAGNOSIS — Z66 Do not resuscitate: Secondary | ICD-10-CM | POA: Diagnosis present

## 2016-05-10 DIAGNOSIS — N179 Acute kidney failure, unspecified: Secondary | ICD-10-CM | POA: Diagnosis present

## 2016-05-10 DIAGNOSIS — Z7982 Long term (current) use of aspirin: Secondary | ICD-10-CM | POA: Diagnosis not present

## 2016-05-10 DIAGNOSIS — E860 Dehydration: Secondary | ICD-10-CM | POA: Diagnosis present

## 2016-05-10 DIAGNOSIS — I739 Peripheral vascular disease, unspecified: Secondary | ICD-10-CM | POA: Diagnosis present

## 2016-05-10 DIAGNOSIS — R0602 Shortness of breath: Secondary | ICD-10-CM

## 2016-05-10 DIAGNOSIS — I959 Hypotension, unspecified: Secondary | ICD-10-CM | POA: Diagnosis present

## 2016-05-10 DIAGNOSIS — I509 Heart failure, unspecified: Secondary | ICD-10-CM | POA: Diagnosis present

## 2016-05-10 DIAGNOSIS — R778 Other specified abnormalities of plasma proteins: Secondary | ICD-10-CM

## 2016-05-10 LAB — LACTIC ACID, PLASMA
Lactic Acid, Venous: 2.9 mmol/L (ref 0.5–1.9)
Lactic Acid, Venous: 3.7 mmol/L (ref 0.5–1.9)

## 2016-05-10 LAB — URINALYSIS, COMPLETE (UACMP) WITH MICROSCOPIC
Bacteria, UA: NONE SEEN
Bilirubin Urine: NEGATIVE
Glucose, UA: NEGATIVE mg/dL
HGB URINE DIPSTICK: NEGATIVE
Ketones, ur: 5 mg/dL — AB
LEUKOCYTES UA: NEGATIVE
NITRITE: NEGATIVE
Protein, ur: 100 mg/dL — AB
SPECIFIC GRAVITY, URINE: 1.017 (ref 1.005–1.030)
SQUAMOUS EPITHELIAL / LPF: NONE SEEN
pH: 6 (ref 5.0–8.0)

## 2016-05-10 LAB — CBC
HCT: 33.5 % — ABNORMAL LOW (ref 35.0–47.0)
Hemoglobin: 11.1 g/dL — ABNORMAL LOW (ref 12.0–16.0)
MCH: 30.3 pg (ref 26.0–34.0)
MCHC: 33.2 g/dL (ref 32.0–36.0)
MCV: 91.2 fL (ref 80.0–100.0)
PLATELETS: 254 10*3/uL (ref 150–440)
RBC: 3.67 MIL/uL — ABNORMAL LOW (ref 3.80–5.20)
RDW: 17.2 % — AB (ref 11.5–14.5)
WBC: 8.7 10*3/uL (ref 3.6–11.0)

## 2016-05-10 LAB — COMPREHENSIVE METABOLIC PANEL
ALT: 21 U/L (ref 14–54)
AST: 37 U/L (ref 15–41)
Albumin: 3.6 g/dL (ref 3.5–5.0)
Alkaline Phosphatase: 70 U/L (ref 38–126)
Anion gap: 13 (ref 5–15)
BILIRUBIN TOTAL: 0.6 mg/dL (ref 0.3–1.2)
BUN: 71 mg/dL — ABNORMAL HIGH (ref 6–20)
CALCIUM: 9.4 mg/dL (ref 8.9–10.3)
CO2: 28 mmol/L (ref 22–32)
CREATININE: 1.52 mg/dL — AB (ref 0.44–1.00)
Chloride: 100 mmol/L — ABNORMAL LOW (ref 101–111)
GFR calc Af Amer: 33 mL/min — ABNORMAL LOW (ref >60)
GFR calc non Af Amer: 29 mL/min — ABNORMAL LOW (ref >60)
Glucose, Bld: 116 mg/dL — ABNORMAL HIGH (ref 65–99)
Potassium: 4.4 mmol/L (ref 3.5–5.1)
Sodium: 141 mmol/L (ref 135–145)
TOTAL PROTEIN: 7.8 g/dL (ref 6.5–8.1)

## 2016-05-10 LAB — BRAIN NATRIURETIC PEPTIDE: B NATRIURETIC PEPTIDE 5: 823 pg/mL — AB (ref 0.0–100.0)

## 2016-05-10 LAB — LIPASE, BLOOD: Lipase: 128 U/L — ABNORMAL HIGH (ref 11–51)

## 2016-05-10 LAB — VALPROIC ACID LEVEL: Valproic Acid Lvl: 25 ug/mL — ABNORMAL LOW (ref 50.0–100.0)

## 2016-05-10 LAB — TROPONIN I: TROPONIN I: 0.1 ng/mL — AB (ref <0.03)

## 2016-05-10 MED ORDER — PIPERACILLIN-TAZOBACTAM 3.375 G IVPB
INTRAVENOUS | Status: AC
  Administered 2016-05-10: 3.375 g via INTRAVENOUS
  Filled 2016-05-10: qty 50

## 2016-05-10 MED ORDER — SODIUM CHLORIDE 0.9 % IV SOLN
Freq: Once | INTRAVENOUS | Status: AC
  Administered 2016-05-10: 20:00:00 via INTRAVENOUS

## 2016-05-10 MED ORDER — PIPERACILLIN-TAZOBACTAM 3.375 G IVPB 30 MIN
3.3750 g | Freq: Once | INTRAVENOUS | Status: AC
  Administered 2016-05-10: 3.375 g via INTRAVENOUS
  Filled 2016-05-10: qty 50

## 2016-05-10 NOTE — ED Provider Notes (Addendum)
[facility] Emergency Department Provider Note   ____________________________________________   First MD Initiated Contact with Patient 2016-06-03 1908     (approximate)  I have reviewed the triage vital signs and the nursing notes.   HISTORY  Chief Complaint Nausea and Diarrhea  History compromised by fact that the patient is not speaking and apparently does not speak English and no translator is available that the patient will consent to talk to  HPI Diamond [name] is a 83 y.o. female comes from nursing home with report of nausea and diarrhea. In the emergency room patient's lying in bed tachycardic she winces on palpation of the abdomen but really will not speak. She did speak apparently in [place] to the nurse. She will not respond on the phone to the [place] translator and I cannot even repeating what the [place] translator says on the phone get the get a response from the patient. We do not have access to [place] translator on the skipe machine.   Past Medical History:  Diagnosis Date  . Dementia   . Hypertension   . Hypothyroid   . Low blood potassium     There are no active problems to display for this patient.   History reviewed. No pertinent surgical history.  Prior to Admission medications   Medication Sig Start Date End Date Taking? Authorizing Provider  acetaminophen (TYLENOL) 500 MG tablet Take 500 mg by mouth every 4 (four) hours as needed.   Yes Historical Provider, MD  alum & mag hydroxide-simeth (MINTOX) 200-200-20 MG/5ML suspension Take 30 mLs by mouth every 6 (six) hours as needed for indigestion or heartburn (take 30 mls up to 4 times daily prn heatburn/indegestion).   Yes Historical Provider, MD  amLODipine (NORVASC) 5 MG tablet Take 5 mg by mouth daily.   Yes Historical Provider, MD  aspirin 81 MG chewable tablet Chew 81 mg by mouth daily.   Yes Historical Provider, MD  atenolol (TENORMIN) 50 MG tablet Take 50 mg by mouth daily.    Yes Historical Provider, MD  divalproex (DEPAKOTE SPRINKLE) 125 MG capsule Take 250 mg by mouth at bedtime.   Yes Historical Provider, MD  docusate sodium (COLACE) 100 MG capsule Take 100 mg by mouth 2 (two) times daily.   Yes Historical Provider, MD  ferrous sulfate 325 (65 FE) MG tablet Take 325 mg by mouth daily.   Yes Historical Provider, MD  guaifenesin (ROBAFEN) 100 MG/5ML syrup Take 200 mg by mouth 4 (four) times daily as needed for cough.   Yes Historical Provider, MD  hydrochlorothiazide (HYDRODIURIL) 12.5 MG tablet Take 12.5 mg by mouth daily.   Yes Historical Provider, MD  levothyroxine (SYNTHROID, LEVOTHROID) 125 MCG tablet Take 125 mcg by mouth daily before breakfast. Every morning with 8 oz of water before breakfast   Yes Historical Provider, MD  loperamide (IMODIUM) 2 MG capsule Take 2 mg by mouth as needed for diarrhea or loose stools.   Yes Historical Provider, MD  magnesium hydroxide (MILK OF MAGNESIA) 400 MG/5ML suspension Take 30 mLs by mouth daily as needed for mild constipation (30 mls by mouth qhs prn constipation).   Yes Historical Provider, MD  Menthol-Zinc Oxide (RISAMINE) 0.44-20.625 % OINT Apply 1 application topically daily as needed (apply to aa prn skin irritation).   Yes Historical Provider, MD  multivitamin-lutein (OCUVITE-LUTEIN) CAPS capsule Take 1 capsule by mouth daily.   Yes Historical Provider, MD  neomycin-bacitracin-polymyxin (NEOSPORIN) ointment Apply 1 application topically as needed for wound  care (app ointment to minor skin tears or abrasions, cover with bandaid or gauze and tape prn until healed). app ointment to minor skin tears or abrasions, cover with bandaid or gauze and tape prn until healed   Yes Historical Provider, MD  oseltamivir (TAMIFLU) 75 MG capsule Take 75 mg by mouth 2 (two) times daily. 05/09/16 05/13/16 Yes Historical Provider, MD  POLYETHYLENE GLYCOL 3350 PO Take 17 g by mouth daily as needed. Mix 17g with 8 oz of fluid every day prn   Yes  Historical Provider, MD  sertraline (ZOLOFT) 50 MG tablet Take 50 mg by mouth daily.   Yes Historical Provider, MD  valsartan (DIOVAN) 320 MG tablet Take 320 mg by mouth daily.   Yes Historical Provider, MD  vitamin B-12 (CYANOCOBALAMIN) 1000 MCG tablet Take 1,000 mcg by mouth once a week.   Yes Historical Provider, MD    Allergies Patient has no known allergies.  No family history on file.  Social History Social History  Substance Use Topics  . Smoking status: Unknown If Ever Smoked  . Smokeless tobacco: Not on file  . Alcohol use No    Review of Systems  unable to obtain  ____________________________________________   PHYSICAL EXAM:  VITAL SIGNS: ED Triage Vitals  Enc Vitals Group     BP --      Pulse --      Resp --      Temp May 29, 2016 1851 97.5 F (36.4 C)     Temp Source May 29, 2016 1851 Axillary     SpO2 --      Weight May 29, 2016 1838 90 lb (40.8 kg)     Height 05/29/2016 1838 4\' 3"  (1.295 m)     Head Circumference --      Peak Flow --      Pain Score --      Pain Loc --      Pain Edu? --      Excl. in GC? --     Constitutional: awake will not speak does look around sits up once winces on palpation of the abdomen Eyes: Conjunctivae are normal. PERRL. EOMI. Head: Atraumatic. Nose: No congestion/rhinnorhea. Mouth/Throat: Mucous membranes are moist.  Oropharynx non-erythematous. Neck: No stridor.  ardiovascular: Normal rate, regular rhythm. Grossly normal heart sounds.  Good peripheral circulation. Respiratory: Normal respiratory effort.  No retractions. Lungs CTAB. Gastrointestinal: Softatient winces on palpation diffusely No distention. No abdominal bruits. No apparentCVA tenderness. Musculoskeletal: No lower extremity tenderness nor edema.  No joint effusions. Neurologic:  Normal speech and language. No gross focal neurologic deficits are appreciated. No gait instability. Skin:  Skin is warm, dry and intact. No rash  noted.  ____________________________________________   LABS (all labs ordered are listed, but only abnormal results are displayed)  Labs Reviewed  LIPASE, BLOOD - Abnormal; Notable for the following:       Result Value   Lipase 128 (*)    All other components within normal limits  COMPREHENSIVE METABOLIC PANEL - Abnormal; Notable for the following:    Chloride 100 (*)    Glucose, Bld 116 (*)    BUN 71 (*)    Creatinine, Ser 1.52 (*)    GFR calc non Af Amer 29 (*)    GFR calc Af Amer 33 (*)    All other components within normal limits  CBC - Abnormal; Notable for the following:    RBC 3.67 (*)    Hemoglobin 11.1 (*)    HCT 33.5 (*)  RDW 17.2 (*)    All other components within normal limits  BLOOD GAS, VENOUS - Abnormal; Notable for the following:    Bicarbonate 30.4 (*)    Acid-Base Excess 4.7 (*)    All other components within normal limits  LACTIC ACID, PLASMA - Abnormal; Notable for the following:    Lactic Acid, Venous 2.9 (*)    All other components within normal limits  BRAIN NATRIURETIC PEPTIDE - Abnormal; Notable for the following:    B Natriuretic Peptide 823.0 (*)    All other components within normal limits  TROPONIN I - Abnormal; Notable for the following:    Troponin I 0.10 (*)    All other components within normal limits  URINALYSIS, COMPLETE (UACMP) WITH MICROSCOPIC  LACTIC ACID, PLASMA  VALPROIC ACID LEVEL   ____________________________________________  EKG  EKG read and interpreted by me shows an irregularly irregular rhythm most consistent with atrial fibrillation apparent  Normal axis there are diffuse ST segment downsloping with T-wave inversions present ____________________________________________  RADIOLOGY  Study Result   CLINICAL DATA:  Recent fall, not responding to questions.  EXAM: CT HEAD WITHOUT CONTRAST  TECHNIQUE: Contiguous axial images were obtained from the base of the skull through the vertex without intravenous  contrast.  COMPARISON:  05/01/2016 CT  FINDINGS: Brain: Moderate superficial and central atrophy with moderate degree of chronic small vessel ischemia in the periventricular and subcortical white matter. No acute large vascular territory infarction. Bilateral basal ganglial idiopathic calcifications. No effacement of the basal cisterns. Fourth ventricle is midline. No intra axial mass nor extra-axial fluid.  Vascular: No hyperdense vessel or unexpected calcification.  Skull: Normal. Negative for fracture or focal lesion.  Sinuses/Orbits: No acute finding.  Other: None.  IMPRESSION: Chronic superficial and central atrophy with moderate degree of chronic small vessel involving subcortical and periventricular white matter. No acute intracranial abnormality.   Electronically Signed   By: Tollie Eth M.D.   On: May 19, 2016 21:00    ____________________________________________   PROCEDURES  Procedure(s) performed:   Procedures  Critical Care performed:   ____________________________________________   INITIAL IMPRESSION / ASSESSMENT AND PLAN / ED COURSE  Pertinent labs & imaging results that were available during my care of the patient were reviewed by me and considered in my medical decision making (see chart for details).        ____________________________________________   FINAL CLINICAL IMPRESSION(S) / ED DIAGNOSES  Final diagnoses:  Dehydration  Acute congestive heart failure, unspecified congestive heart failure type (HCC)  Elevated troponin      NEW MEDICATIONS STARTED DURING THIS VISIT:  New Prescriptions   No medications on file     Note:  This document was prepared using Dragon voice recognition software and may include unintentional dictation errors.    Arnaldo Natal, MD May 19, 2016 2129 Patient's blood pressure is dropping her heart rate is going up, her rectal exam is Hemoccult negative which means I doubt she has ischemic  bowel I will increase the rate of fluids or was worried about possible worsening of the CHF that she appears to have based on her elevated BNP however this has not happened and so as I said I will increase the fluid rate to see if I can ameliorate her condition.    Discussed with patient's daughter who is her power of attorney. Patient has a living will and healthcare perpetrated specifying no hydration or artificial nutrition case of persistent vegetative state and nothing to prolong her dying process. While patient  is not in a persistent vegetative state she is acutely ill her daughter agrees that patient should not get CPR as that will likely result in the breaking of all of her ribs and lung contusions etc. patient also has expressed a desire not to be intubated so we will treat her medically but not plan on CPR or intubation at this point. The daughter is aware that the patient is seriously ill and could easily die. The weather is very bad and icy conditions however and [place] work today because of the weather conditions I advised her that we will notify her if anything happens tonight and she can call the hospital in the morning for do not call her to let her know how her mother is doing.   Arnaldo NatalPaul F Rivaldo Hineman, MD 05/08/2016 [phone]

## 2016-05-10 NOTE — ED Triage Notes (Signed)
Pt comes into the ED via EMS from [facility] c/o emesis and diarrhea.  Patient only speaks german.  Patient presents in NAD at this time with even and unlabored respirations.

## 2016-05-10 NOTE — H&P (Addendum)
History and Physical   [facility] - [place] @ [facility] Admission History and Physical [employer], D.O.    Patient Name: Diamond [name] MR#: [id number] Date of Birth: 10/02/1921 Date of Admission: Jun 02, 2016  Referring MD/NP/PA: Dr. Darnelle Catalan Primary Care Physician: No PCP Per Patient Patient coming from: Nursing home  Chief Complaint: Nausea and diarrhea  HPI: Diamond [name] is a 83 y.o. female with a known history of dementia, hypertension, hypothyroidism on Tamiflu for presumed influenza was sent to the emergency department from her nursing home secondary to nausea and diarrhea. Patient has a history significant for dementia. Patient gives no history. She is [place] speaking and is unable to converse even via Nurse, learning disability. Therefore I am unable to obtain an accurate history. Dr. Darnelle Catalan did speak with the patient's daughter.  Review of Systems:  Unable to obtain secondary to mental status, dementia   Past Medical History:  Diagnosis Date  . Dementia   . Hypertension   . Hypothyroid   . Low blood potassium     History reviewed. No pertinent surgical history.  No Known Allergies  No family history on file.   Prior to Admission medications   Medication Sig Start Date End Date Taking? Authorizing Provider  acetaminophen (TYLENOL) 500 MG tablet Take 500 mg by mouth every 4 (four) hours as needed.   Yes Historical Provider, MD  alum & mag hydroxide-simeth (MINTOX) 200-200-20 MG/5ML suspension Take 30 mLs by mouth every 6 (six) hours as needed for indigestion or heartburn (take 30 mls up to 4 times daily prn heatburn/indegestion).   Yes Historical Provider, MD  amLODipine (NORVASC) 5 MG tablet Take 5 mg by mouth daily.   Yes Historical Provider, MD  aspirin 81 MG chewable tablet Chew 81 mg by mouth daily.   Yes Historical Provider, MD  atenolol (TENORMIN) 50 MG tablet Take 50 mg by mouth daily.   Yes Historical Provider, MD  divalproex (DEPAKOTE SPRINKLE) 125 MG capsule  Take 250 mg by mouth at bedtime.   Yes Historical Provider, MD  docusate sodium (COLACE) 100 MG capsule Take 100 mg by mouth 2 (two) times daily.   Yes Historical Provider, MD  ferrous sulfate 325 (65 FE) MG tablet Take 325 mg by mouth daily.   Yes Historical Provider, MD  guaifenesin (ROBAFEN) 100 MG/5ML syrup Take 200 mg by mouth 4 (four) times daily as needed for cough.   Yes Historical Provider, MD  hydrochlorothiazide (HYDRODIURIL) 12.5 MG tablet Take 12.5 mg by mouth daily.   Yes Historical Provider, MD  levothyroxine (SYNTHROID, LEVOTHROID) 125 MCG tablet Take 125 mcg by mouth daily before breakfast. Every morning with 8 oz of water before breakfast   Yes Historical Provider, MD  loperamide (IMODIUM) 2 MG capsule Take 2 mg by mouth as needed for diarrhea or loose stools.   Yes Historical Provider, MD  magnesium hydroxide (MILK OF MAGNESIA) 400 MG/5ML suspension Take 30 mLs by mouth daily as needed for mild constipation (30 mls by mouth qhs prn constipation).   Yes Historical Provider, MD  Menthol-Zinc Oxide (RISAMINE) 0.44-20.625 % OINT Apply 1 application topically daily as needed (apply to aa prn skin irritation).   Yes Historical Provider, MD  multivitamin-lutein (OCUVITE-LUTEIN) CAPS capsule Take 1 capsule by mouth daily.   Yes Historical Provider, MD  neomycin-bacitracin-polymyxin (NEOSPORIN) ointment Apply 1 application topically as needed for wound care (app ointment to minor skin tears or abrasions, cover with bandaid or gauze and tape prn until healed). app ointment to minor skin  tears or abrasions, cover with bandaid or gauze and tape prn until healed   Yes Historical Provider, MD  oseltamivir (TAMIFLU) 75 MG capsule Take 75 mg by mouth 2 (two) times daily. 05/09/16 05/13/16 Yes Historical Provider, MD  POLYETHYLENE GLYCOL 3350 PO Take 17 g by mouth daily as needed. Mix 17g with 8 oz of fluid every day prn   Yes Historical Provider, MD  sertraline (ZOLOFT) 50 MG tablet Take 50 mg by mouth  daily.   Yes Historical Provider, MD  valsartan (DIOVAN) 320 MG tablet Take 320 mg by mouth daily.   Yes Historical Provider, MD  vitamin B-12 (CYANOCOBALAMIN) 1000 MCG tablet Take 1,000 mcg by mouth once a week.   Yes Historical Provider, MD    Physical Exam: Vitals:   10/15/2016 1838 10/15/2016 1851 10/15/2016 2201  BP:   (!) 112/50  Pulse:   90  Resp:   (!) 25  Temp:  97.5 F (36.4 C)   TempSrc:  Axillary   SpO2:   92%  Weight: 40.8 kg (90 lb)    Height: 4\' 3"  (1.295 m)      GENERAL: 83 y.o.-year-old Agitated, chronically ill-appearing white female patient. HEENT: Head atraumatic, normocephalic. Pupils equal, round, reactive to light and accommodation. No scleral icterus. Nares are patent. Oropharynx is clear. Mucus membranes are very dry. NECK: Supple, full range of motion. No JVD, no bruit heard. No thyroid enlargement, no tenderness, no cervical lymphadenopathy. CHEST: Normal breath sounds bilaterally. No wheezing, rales, rhonchi or crackles. No use of accessory muscles of respiration.  No reproducible chest wall tenderness.  CARDIOVASCULAR: S1, S2. Cap refill <2 seconds. Pulses intact distally.  ABDOMEN: Soft, nondistended, nontender. No rebound, guarding, rigidity. Normoactive bowel sounds present in all four quadrants. No organomegaly or mass. EXTREMITIES: Mottled skin bilateral hands and lower extremities with chronic changes of peripheral vascular disease.. No calf tenderness or Homan's sign.  NEUROLOGIC: Unable to assess secondary to altered mental status, dementia. Gait not checked.   Labs on Admission:  CBC:  Recent Labs Lab 10/15/2016 1911  WBC 8.7  HGB 11.1*  HCT 33.5*  MCV 91.2  PLT 254   Basic Metabolic Panel:  Recent Labs Lab 10/15/2016 1911  NA 141  K 4.4  CL 100*  CO2 28  GLUCOSE 116*  BUN 71*  CREATININE 1.52*  CALCIUM 9.4   GFR: Estimated Creatinine Clearance: 11.6 mL/min (by C-G formula based on SCr of 1.52 mg/dL (H)). Liver Function  Tests:  Recent Labs Lab 10/15/2016 1911  AST 37  ALT 21  ALKPHOS 70  BILITOT 0.6  PROT 7.8  ALBUMIN 3.6    Recent Labs Lab 10/15/2016 1911  LIPASE 128*   No results for input(s): AMMONIA in the last 168 hours. Coagulation Profile: No results for input(s): INR, PROTIME in the last 168 hours. Cardiac Enzymes:  Recent Labs Lab 10/15/2016 1911  TROPONINI 0.10*   BNP (last 3 results) No results for input(s): PROBNP in the last 8760 hours. HbA1C: No results for input(s): HGBA1C in the last 72 hours. CBG: No results for input(s): GLUCAP in the last 168 hours. Lipid Profile: No results for input(s): CHOL, HDL, LDLCALC, TRIG, CHOLHDL, LDLDIRECT in the last 72 hours. Thyroid Function Tests: No results for input(s): TSH, T4TOTAL, FREET4, T3FREE, THYROIDAB in the last 72 hours. Anemia Panel: No results for input(s): VITAMINB12, FOLATE, FERRITIN, TIBC, IRON, RETICCTPCT in the last 72 hours. Urine analysis:    Component Value Date/Time   COLORURINE Yellow 06/20/2012 1730  APPEARANCEUR Hazy 06/20/2012 1730   LABSPEC 1.014 06/20/2012 1730   PHURINE 5.0 06/20/2012 1730   GLUCOSEU Negative 06/20/2012 1730   HGBUR Negative 06/20/2012 1730   BILIRUBINUR Negative 06/20/2012 1730   KETONESUR Negative 06/20/2012 1730   PROTEINUR Negative 06/20/2012 1730   NITRITE Negative 06/20/2012 1730   LEUKOCYTESUR 3+ 06/20/2012 1730   Sepsis Labs: @LABRCNTIP (procalcitonin:4,lacticidven:4) ) Recent Results (from the past 240 hour(s))  Blood Culture (routine x 2)     Status: None   Collection Time: 05/01/16 12:32 PM  Result Value Ref Range Status   Specimen Description BLOOD L AC  Final   Special Requests   Final    BOTTLES DRAWN AEROBIC AND ANAEROBIC AER ANA   Culture NO GROWTH 5 DAYS  Final   Report Status 05/06/2016 FINAL  Final     Radiological Exams on Admission: Ct Abdomen Pelvis Wo Contrast  Result Date: 05/14/2016 CLINICAL DATA:  Emesis and diarrhea. EXAM: CT CHEST,  ABDOMEN AND PELVIS WITHOUT CONTRAST TECHNIQUE: Multidetector CT imaging of the chest, abdomen and pelvis was performed following the standard protocol without IV contrast. COMPARISON:  CXR 05/01/2016, cervical spine CT 03/01/2012 FINDINGS: CT CHEST FINDINGS Cardiovascular: Heart size is normal with coronary arteriosclerosis. No pericardial effusion. Aortic atherosclerosis is identified without aneurysm. Mild ectasia of the ascending aorta. Bovine arch anatomy of the great vessels with atherosclerosis. Mediastinum/Nodes: Thyroid gland is unremarkable. Trachea and mainstem bronchi are patent. No mediastinal nor hilar lymphadenopathy. Lungs/Pleura: Mild bronchiectasis to the right upper lobe with faint ground-glass opacities in the upper lobes possibly related to areas of air trapping and/or hypoventilatory change. Atelectasis in the right middle lobe and lingular distribution. No effusion or pneumothorax. Mild thickening along the minor fissure. Musculoskeletal: Chronic mild superior endplate compression of T1. Moderate compression fracture of L1. CT ABDOMEN PELVIS FINDINGS Hepatobiliary: The unenhanced liver is unremarkable. There is no biliary dilatation. There is moderate gallbladder distention possibly from a fasting state. No bowel stones or wall thickening. Pancreas: Nonacute. Spleen: No splenomegaly. Adrenals/Urinary Tract: The adrenal glands are not well visualized. No obstructive uropathy of the kidneys. Punctate calcification in the interpolar and lower pole of the right kidney. Probable tiny cyst in the lower pole the right kidney measuring approximately 7 mm. Urinary bladder is markedly distended measuring 17.3 x 11.5 x 11.2 cm (volume = 1170 cm^3) extending to the level of umbilicus. No bladder stones or mass. Stomach/Bowel: No bowel obstruction. Limited assessment due to displacement by markedly distended bladder. Vascular/Lymphatic: Aortoiliac and branch vessel atherosclerosis with ectatic appearance of  the abdominal aorta. Reproductive: Uterus not visualized.  No adnexal mass is seen. Other: Hernia.  No free air. Musculoskeletal: Moderate compression of L1 without retropulsion. Degenerative disc disease T7 through T11. Degenerative disc disease L3 through S1. IMPRESSION: 1. Mild right upper lobe bronchiectasis. Faint mosaic pattern of ground-glass opacity in the upper lobes possibly related to air trapping and adjacent areas of hypoventilatory change. 2. Markedly distended urinary bladder question neurogenic bladder. Foley decompression is recommended. Along the bladder is estimated at 1.17 liters. 3. Aortoiliac and branch vessel atherosclerosis. 4. Thoracolumbar spondylosis with likely chronic compression deformities of T1, L1 and multilevel degenerative disc disease from T7 through T11 and from L3 through S1. Electronically Signed   By: Tollie Eth M.D.   On: 05/08/2016 21:13   Ct Head Wo Contrast  Result Date: 05/14/2016 CLINICAL DATA:  Recent fall, not responding to questions. EXAM: CT HEAD WITHOUT CONTRAST TECHNIQUE: Contiguous axial images were obtained from  the base of the skull through the vertex without intravenous contrast. COMPARISON:  05/01/2016 CT FINDINGS: Brain: Moderate superficial and central atrophy with moderate degree of chronic small vessel ischemia in the periventricular and subcortical white matter. No acute large vascular territory infarction. Bilateral basal ganglial idiopathic calcifications. No effacement of the basal cisterns. Fourth ventricle is midline. No intra axial mass nor extra-axial fluid. Vascular: No hyperdense vessel or unexpected calcification. Skull: Normal. Negative for fracture or focal lesion. Sinuses/Orbits: No acute finding. Other: None. IMPRESSION: Chronic superficial and central atrophy with moderate degree of chronic small vessel involving subcortical and periventricular white matter. No acute intracranial abnormality. Electronically Signed   By: Tollie Eth  M.D.   On: 05/12/2016 21:00   Ct Chest Wo Contrast  Result Date: 05/12/16 CLINICAL DATA:  Emesis and diarrhea. EXAM: CT CHEST, ABDOMEN AND PELVIS WITHOUT CONTRAST TECHNIQUE: Multidetector CT imaging of the chest, abdomen and pelvis was performed following the standard protocol without IV contrast. COMPARISON:  CXR 05/01/2016, cervical spine CT 03/01/2012 FINDINGS: CT CHEST FINDINGS Cardiovascular: Heart size is normal with coronary arteriosclerosis. No pericardial effusion. Aortic atherosclerosis is identified without aneurysm. Mild ectasia of the ascending aorta. Bovine arch anatomy of the great vessels with atherosclerosis. Mediastinum/Nodes: Thyroid gland is unremarkable. Trachea and mainstem bronchi are patent. No mediastinal nor hilar lymphadenopathy. Lungs/Pleura: Mild bronchiectasis to the right upper lobe with faint ground-glass opacities in the upper lobes possibly related to areas of air trapping and/or hypoventilatory change. Atelectasis in the right middle lobe and lingular distribution. No effusion or pneumothorax. Mild thickening along the minor fissure. Musculoskeletal: Chronic mild superior endplate compression of T1. Moderate compression fracture of L1. CT ABDOMEN PELVIS FINDINGS Hepatobiliary: The unenhanced liver is unremarkable. There is no biliary dilatation. There is moderate gallbladder distention possibly from a fasting state. No bowel stones or wall thickening. Pancreas: Nonacute. Spleen: No splenomegaly. Adrenals/Urinary Tract: The adrenal glands are not well visualized. No obstructive uropathy of the kidneys. Punctate calcification in the interpolar and lower pole of the right kidney. Probable tiny cyst in the lower pole the right kidney measuring approximately 7 mm. Urinary bladder is markedly distended measuring 17.3 x 11.5 x 11.2 cm (volume = 1170 cm^3) extending to the level of umbilicus. No bladder stones or mass. Stomach/Bowel: No bowel obstruction. Limited assessment due to  displacement by markedly distended bladder. Vascular/Lymphatic: Aortoiliac and branch vessel atherosclerosis with ectatic appearance of the abdominal aorta. Reproductive: Uterus not visualized.  No adnexal mass is seen. Other: Hernia.  No free air. Musculoskeletal: Moderate compression of L1 without retropulsion. Degenerative disc disease T7 through T11. Degenerative disc disease L3 through S1. IMPRESSION: 1. Mild right upper lobe bronchiectasis. Faint mosaic pattern of ground-glass opacity in the upper lobes possibly related to air trapping and adjacent areas of hypoventilatory change. 2. Markedly distended urinary bladder question neurogenic bladder. Foley decompression is recommended. Along the bladder is estimated at 1.17 liters. 3. Aortoiliac and branch vessel atherosclerosis. 4. Thoracolumbar spondylosis with likely chronic compression deformities of T1, L1 and multilevel degenerative disc disease from T7 through T11 and from L3 through S1. Electronically Signed   By: Tollie Eth M.D.   On: 05/12/2016 21:13    EKG: Atrial fibrillation with normal axis and nonspecific ST-T wave changes. T-wave inversions.   Assessment/Plan  This is a 83 y.o. female with a history of dementia, hypertension, hypothyroidism now being admitted with:  Patient is acutely very ill meeting sepsis criteria with hypotension, tachycardia, clinically very dehydrated with AKI and elevated  BNP and troponin.  Per Dr. Farrel Gobble conversation with the patient's daughter who is her healthcare proxy, we will treat her medically without any heroic measures. - Telemetry monitoring with continuous pulse ox - Beta blocker, ACE, diuretic, nitrate would all be contraindicated at this point secondary to unstable vital signs. - Intake/output, daily weight. - Trend troponins, check lipids and TSH. - Consider Echo in a.m. - Cardiology consultation requested. - hold HCTZ and Avapro -Continue Tamiflu -Continue Zosyn. Add vancomycin for  sepsis with unclear etiology -Elevated troponin likely secondary to demand ischemia given severe illness  Admission status: Inpatient IV Fluids: Normal saline Diet/Nutrition: Nothing by mouth given altered mental status Consults called: Palliative Care DVT Px: Lovenox, SCDs and early ambulation. Code Status: Patient is DO NOT RESUSCITATE/DO NOT INTUBATE. Dr. Darnelle Catalan did document his conversation with the patient's daughter who is her healthcare proxy. She is to be treated medically without any CPR or intubation. Disposition Plan: To be determined   All the records are reviewed and case discussed with ED provider. Management plans discussed with the patient and/or family who express understanding and agree with plan of care.  Sarajane Fambrough D.O. on 06-07-2016 at 10:18 PM Between 7am to 6pm - Pager - [phone] After 6pm go to [email or web address] - Biomedical engineer [place] [facility] Office [phone] CC: Primary care physician; No PCP Per Patient   2016/06/07, 10:18 PM

## 2016-05-11 LAB — COMPREHENSIVE METABOLIC PANEL
ALT: 26 U/L (ref 14–54)
AST: 64 U/L — ABNORMAL HIGH (ref 15–41)
Albumin: 3.6 g/dL (ref 3.5–5.0)
Alkaline Phosphatase: 75 U/L (ref 38–126)
Anion gap: 23 — ABNORMAL HIGH (ref 5–15)
BILIRUBIN TOTAL: 0.7 mg/dL (ref 0.3–1.2)
BUN: 72 mg/dL — ABNORMAL HIGH (ref 6–20)
CO2: 14 mmol/L — ABNORMAL LOW (ref 22–32)
CREATININE: 1.8 mg/dL — AB (ref 0.44–1.00)
Calcium: 8.9 mg/dL (ref 8.9–10.3)
Chloride: 108 mmol/L (ref 101–111)
GFR, EST AFRICAN AMERICAN: 27 mL/min — AB (ref >60)
GFR, EST NON AFRICAN AMERICAN: 23 mL/min — AB (ref >60)
Glucose, Bld: 88 mg/dL (ref 65–99)
POTASSIUM: 4.9 mmol/L (ref 3.5–5.1)
Sodium: 145 mmol/L (ref 135–145)
TOTAL PROTEIN: 7.7 g/dL (ref 6.5–8.1)

## 2016-05-11 LAB — CBC
HCT: 34.5 % — ABNORMAL LOW (ref 35.0–47.0)
Hemoglobin: 10.9 g/dL — ABNORMAL LOW (ref 12.0–16.0)
MCH: 30.1 pg (ref 26.0–34.0)
MCHC: 31.6 g/dL — AB (ref 32.0–36.0)
MCV: 95.1 fL (ref 80.0–100.0)
Platelets: 211 10*3/uL (ref 150–440)
RBC: 3.62 MIL/uL — ABNORMAL LOW (ref 3.80–5.20)
RDW: 18.1 % — AB (ref 11.5–14.5)
WBC: 7.4 10*3/uL (ref 3.6–11.0)

## 2016-05-11 LAB — LIPID PANEL
CHOLESTEROL: 265 mg/dL — AB (ref 0–200)
HDL: 41 mg/dL (ref >40)
LDL Cholesterol: 179 mg/dL — ABNORMAL HIGH (ref 0–99)
TRIGLYCERIDES: 225 mg/dL — AB (ref <150)
Total CHOL/HDL Ratio: 6.5 RATIO
VLDL: 45 mg/dL — AB (ref 0–40)

## 2016-05-11 LAB — TROPONIN I: TROPONIN I: 0.12 ng/mL — AB (ref <0.03)

## 2016-05-11 LAB — MRSA PCR SCREENING: MRSA by PCR: NEGATIVE

## 2016-05-11 LAB — TSH: TSH: 13.386 u[IU]/mL — AB (ref 0.350–4.500)

## 2016-05-11 MED ORDER — ALUM & MAG HYDROXIDE-SIMETH 200-200-20 MG/5ML PO SUSP: 30.0000 mL | Freq: Four times a day (QID) | ORAL | Status: DC | PRN

## 2016-05-11 MED ORDER — MENTHOL-ZINC OXIDE 0.44-20.625 % EX OINT
1.0000 "application " | TOPICAL_OINTMENT | Freq: Every day | CUTANEOUS | Status: DC | PRN
  Filled 2016-05-11: qty 71

## 2016-05-11 MED ORDER — DOCUSATE SODIUM 100 MG PO CAPS: 100.0000 mg | ORAL_CAPSULE | Freq: Two times a day (BID) | ORAL | Status: DC

## 2016-05-11 MED ORDER — OCUVITE-LUTEIN PO CAPS: 1.0000 | ORAL_CAPSULE | Freq: Every day | ORAL | Status: DC

## 2016-05-11 MED ORDER — MAGNESIUM HYDROXIDE 400 MG/5ML PO SUSP: 30.0000 mL | Freq: Every day | ORAL | Status: DC | PRN

## 2016-05-11 MED ORDER — SERTRALINE HCL 50 MG PO TABS: 50.0000 mg | ORAL_TABLET | Freq: Every day | ORAL | Status: DC

## 2016-05-11 MED ORDER — OSELTAMIVIR PHOSPHATE 75 MG PO CAPS: 75.0000 mg | ORAL_CAPSULE | Freq: Two times a day (BID) | ORAL | Status: DC

## 2016-05-11 MED ORDER — OSELTAMIVIR PHOSPHATE 30 MG PO CAPS: 30.0000 mg | ORAL_CAPSULE | Freq: Every day | ORAL | Status: DC

## 2016-05-11 MED ORDER — ASPIRIN 81 MG PO CHEW: 81.0000 mg | CHEWABLE_TABLET | Freq: Every day | ORAL | Status: DC

## 2016-05-11 MED ORDER — GUAIFENESIN 100 MG/5ML PO SOLN
200.0000 mg | Freq: Four times a day (QID) | ORAL | Status: DC | PRN
  Filled 2016-05-11: qty 10

## 2016-05-11 MED ORDER — SODIUM CHLORIDE 0.9 % IV BOLUS (SEPSIS): 250.0000 mL | Freq: Once | INTRAVENOUS | Status: DC

## 2016-05-11 MED ORDER — SODIUM CHLORIDE 0.9% FLUSH: 3.0000 mL | Freq: Two times a day (BID) | INTRAVENOUS | Status: DC

## 2016-05-11 MED ORDER — FERROUS SULFATE 325 (65 FE) MG PO TABS
325.0000 mg | ORAL_TABLET | Freq: Every day | ORAL | Status: DC
Start: 2016-05-11 — End: 2016-05-11

## 2016-05-11 MED ORDER — DIVALPROEX SODIUM 125 MG PO CSDR: 250.0000 mg | DELAYED_RELEASE_CAPSULE | Freq: Every day | ORAL | Status: DC

## 2016-05-11 MED ORDER — SODIUM CHLORIDE 0.9% FLUSH: 3.0000 mL | INTRAVENOUS | Status: DC | PRN

## 2016-05-11 MED ORDER — AMLODIPINE BESYLATE 5 MG PO TABS: 5.0000 mg | ORAL_TABLET | Freq: Every day | ORAL | Status: DC

## 2016-05-11 MED ORDER — ONDANSETRON HCL 4 MG PO TABS: 4.0000 mg | ORAL_TABLET | Freq: Four times a day (QID) | ORAL | Status: DC | PRN

## 2016-05-11 MED ORDER — SODIUM CHLORIDE 0.9 % IV SOLN: INTRAVENOUS | Status: DC

## 2016-05-11 MED ORDER — ACETAMINOPHEN 650 MG RE SUPP: 650.0000 mg | Freq: Four times a day (QID) | RECTAL | Status: DC | PRN

## 2016-05-11 MED ORDER — LEVOTHYROXINE SODIUM 25 MCG PO TABS: 125.0000 ug | ORAL_TABLET | Freq: Every day | ORAL | Status: DC

## 2016-05-11 MED ORDER — OXYCODONE HCL 5 MG PO TABS: 5.0000 mg | ORAL_TABLET | ORAL | Status: DC | PRN

## 2016-05-11 MED ORDER — ONDANSETRON HCL 4 MG/2ML IJ SOLN: 4.0000 mg | Freq: Four times a day (QID) | INTRAMUSCULAR | Status: DC | PRN

## 2016-05-11 MED ORDER — VANCOMYCIN HCL IN DEXTROSE 750-5 MG/150ML-% IV SOLN
750.0000 mg | Freq: Once | INTRAVENOUS | Status: DC
  Filled 2016-05-11: qty 150

## 2016-05-11 MED ORDER — PIPERACILLIN-TAZOBACTAM 3.375 G IVPB: 3.3750 g | Freq: Two times a day (BID) | INTRAVENOUS | Status: DC

## 2016-05-11 MED ORDER — ACETAMINOPHEN 325 MG PO TABS: 650.0000 mg | ORAL_TABLET | Freq: Four times a day (QID) | ORAL | Status: DC | PRN

## 2016-05-11 MED ORDER — ATENOLOL 50 MG PO TABS: 50.0000 mg | ORAL_TABLET | Freq: Every day | ORAL | Status: DC

## 2016-05-11 MED ORDER — SODIUM CHLORIDE 0.9 % IV SOLN: 250.0000 mL | INTRAVENOUS | Status: DC | PRN

## 2016-05-11 MED ORDER — ENOXAPARIN SODIUM 40 MG/0.4ML ~~LOC~~ SOLN: 40.0000 mg | SUBCUTANEOUS | Status: DC

## 2016-05-11 MED ORDER — HEPARIN SODIUM (PORCINE) 5000 UNIT/ML IJ SOLN: 5000.0000 [IU] | Freq: Two times a day (BID) | INTRAMUSCULAR | Status: DC

## 2016-05-11 MED ORDER — LOPERAMIDE HCL 2 MG PO CAPS: 2.0000 mg | ORAL_CAPSULE | ORAL | Status: DC | PRN

## 2016-05-11 MED ORDER — ALBUTEROL SULFATE (2.5 MG/3ML) 0.083% IN NEBU: 2.5000 mg | INHALATION_SOLUTION | Freq: Four times a day (QID) | RESPIRATORY_TRACT | Status: DC | PRN

## 2016-05-16 LAB — BLOOD GAS, VENOUS
ACID-BASE EXCESS: 4.7 mmol/L — AB (ref 0.0–2.0)
BICARBONATE: 30.4 mmol/L — AB (ref 20.0–28.0)
Patient temperature: 37
pCO2, Ven: 49 mmHg (ref 44.0–60.0)
pH, Ven: 7.4 (ref 7.250–7.430)

## 2016-05-24 NOTE — Progress Notes (Signed)
   2016/12/27 0600  Clinical Encounter Type  Visited With Patient;Health care provider  Visit Type Initial;Death  Referral From Nurse  Consult/Referral To Faith community  Spiritual Encounters  Spiritual Needs Emotional;Grief support  Stress Factors  Patient Stress Factors None identified  Family Stress Factors None identified  Chaplain received a call to room 107 @ 4:56. Upon arrival Pt. Had passed away. Chaplain is here to provided any spiritual and emotional support. Daughter of Pt. Could not be at bedside however, was on the phone with Pt. When she passed away.

## 2016-05-24 NOTE — Progress Notes (Addendum)
Pharmacy Antibiotic Note  Lin Givenselly L Rohl is a 83 y.o. female admitted on 04/26/2016 with sepsis.  Pharmacy has been consulted for vancomycin dosing.  Plan: Vancomycin 750 mg IV x 1. Repeat BMP this morning SCr 1.8. IVF reordered. Will check vanc random this afternoon and SCr tomorrow with AM labs.    Height: 4\' 3"  (129.5 cm) Weight: 90 lb (40.8 kg) IBW/kg (Calculated) : 24.8  Temp (24hrs), Avg:95.7 F (35.4 C), Min:93.8 F (34.3 C), Max:97.5 F (36.4 C)   Recent Labs Lab 04/29/2016 1911 04/29/2016 1950 05/07/2016 2207 December 24, 2016 0215  WBC 8.7  --   --  7.4  CREATININE 1.52*  --   --   --   LATICACIDVEN  --  2.9* 3.7*  --     Estimated Creatinine Clearance: 11.6 mL/min (by C-G formula based on SCr of 1.52 mg/dL (H)).    No Known Allergies   Thank you for allowing pharmacy to be a part of this patient's care.  Carola FrostNathan A Marina Desire, Pharm.D., BCPS Clinical Pharmacist 2016/11/15 3:11 AM

## 2016-05-24 NOTE — Progress Notes (Signed)
Called by RN taking care of patient for apnea and unresponsiveness. Patient seen and evaluated. No blood pressure and pulse could be recorded. Pupils fixed and dilated and unresponsive to light. Patient declared dead at 5.09 AM Patient is DNR and DNI by code status. Family informed.

## 2016-05-24 NOTE — Progress Notes (Signed)
Pharmacist - Prescriber Communication  1. Oseltamivir 75 mg po BID x 5 days has been changed to 30 mg po once daily x 5 days for CrCl 10 to 30 mL/min. 2. Enoxaparin 40 mg subcutaneously once daily has been changed to heparin 5000 units subcutaneously BID for CrCl less than 15 mL/min and ABW less than 45 kg.   Jessieca Rhem A. [place], [place].D., BCPS Clinical Pharmacist 11/17/2016 0158

## 2016-05-24 NOTE — Progress Notes (Signed)
Pt expired at 0509 with daughter on the phone because she could not be at bedside because of weather conditions. Pt daughter was allowed to talk to pt up to moments before she expired. Pt was not comfort care and was pronounced by Dr. Tobi BastosPyreddy. Pt IV was removed as well as foley cath. Pt had 100ml output in foley at time of death. Per daughter pt is suppose to be going [facility] [employer] [facility] and all arrangements should already be in place. Daughter will be going to funeral home to see body. Offer condolences to family.

## 2016-05-24 DEATH — deceased

## 2016-06-21 NOTE — Discharge Summary (Signed)
[employer] - [place] at [facility]   PATIENT NAME: Diamond [name]    MR#:  [id number]  DATE OF BIRTH:  [birth year]  DATE OF ADMISSION:  05-28-2016   ADMITTING PHYSICIAN: Jon Gills Nolita Kutter, DO  DATE OF DISCHARGE: 05/21/2016  5:09 AM  PRIMARY CARE PHYSICIAN: No PCP Per Patient   ADMISSION DIAGNOSIS:  Dehydration [E86.0] SOB (shortness of breath) [R06.02] Elevated troponin [R74.8] Acute congestive heart failure, unspecified congestive heart failure type (HCC) [I50.9] DISCHARGE DIAGNOSIS:  Active Problems:   Dehydration  SECONDARY DIAGNOSIS:   Past Medical History:  Diagnosis Date  . Dementia   . Hypertension   . Hypothyroid   . Low blood potassium    HOSPITAL COURSE:   Patient was admitted by me on the evening of 05/28/2016 with diagnosis of sepsis, dehydration, AKI, elevated BNP and troponin.  She was started on Zosyn, Vanco, Tamiflu, NS and monitored closely.  Per Dr. Farrel Gobble conversation with the daughter, HCP the patient was DNR/DNI.  Per Dr/ Pyreddy's note on 05/23/2016 at 5:09am, the patient was pronounced and family was informed  DISCHARGE CONDITIONS:  Expired CONSULTS OBTAINED:   DRUG ALLERGIES:  No Known Allergies DISCHARGE MEDICATIONS:   Allergies as of 05/22/2016   No Known Allergies     Medication List    ASK your doctor about these medications   acetaminophen 500 MG tablet Commonly known as:  TYLENOL Take 500 mg by mouth every 4 (four) hours as needed.   amLODipine 5 MG tablet Commonly known as:  NORVASC Take 5 mg by mouth daily.   aspirin 81 MG chewable tablet Chew 81 mg by mouth daily.   atenolol 50 MG tablet Commonly known as:  TENORMIN Take 50 mg by mouth daily.   divalproex 125 MG capsule Commonly known as:  DEPAKOTE SPRINKLE Take 250 mg by mouth at bedtime.   docusate sodium 100 MG capsule Commonly known as:  COLACE Take 100 mg by mouth 2 (two) times daily.   ferrous sulfate 325 (65 FE) MG tablet Take 325 mg by  mouth daily.   hydrochlorothiazide 12.5 MG tablet Commonly known as:  HYDRODIURIL Take 12.5 mg by mouth daily.   levothyroxine 125 MCG tablet Commonly known as:  SYNTHROID, LEVOTHROID Take 125 mcg by mouth daily before breakfast. Every morning with 8 oz of water before breakfast   loperamide 2 MG capsule Commonly known as:  IMODIUM Take 2 mg by mouth as needed for diarrhea or loose stools.   magnesium hydroxide 400 MG/5ML suspension Commonly known as:  MILK OF MAGNESIA Take 30 mLs by mouth daily as needed for mild constipation (30 mls by mouth qhs prn constipation).   MINTOX 200-200-20 MG/5ML suspension Generic drug:  alum & mag hydroxide-simeth Take 30 mLs by mouth every 6 (six) hours as needed for indigestion or heartburn (take 30 mls up to 4 times daily prn heatburn/indegestion).   multivitamin-lutein Caps capsule Take 1 capsule by mouth daily.   neomycin-bacitracin-polymyxin ointment Commonly known as:  NEOSPORIN Apply 1 application topically as needed for wound care (app ointment to minor skin tears or abrasions, cover with bandaid or gauze and tape prn until healed). app ointment to minor skin tears or abrasions, cover with bandaid or gauze and tape prn until healed   oseltamivir 75 MG capsule Commonly known as:  TAMIFLU Take 75 mg by mouth 2 (two) times daily. Ask about: Should I take this medication?   POLYETHYLENE GLYCOL 3350 PO Take 17 g by mouth daily as needed.  Mix 17g with 8 oz of fluid every day prn   RISAMINE 0.44-20.625 % Oint Generic drug:  Menthol-Zinc Oxide Apply 1 application topically daily as needed (apply to aa prn skin irritation).   ROBAFEN 100 MG/5ML syrup Generic drug:  guaifenesin Take 200 mg by mouth 4 (four) times daily as needed for cough.   sertraline 50 MG tablet Commonly known as:  ZOLOFT Take 50 mg by mouth daily.   valsartan 320 MG tablet Commonly known as:  DIOVAN Take 320 mg by mouth daily.   vitamin B-12 1000 MCG  tablet Commonly known as:  CYANOCOBALAMIN Take 1,000 mcg by mouth once a week.        DISCHARGE INSTRUCTIONS:  N/A DIET:  N/A3 DISCHARGE CONDITION:  Expired ACTIVITY:  N/A OXYGEN:  Home Oxygen: No.  Oxygen Delivery: N/A DISCHARGE LOCATION:  [facility]   If you experience worsening of your admission symptoms, develop shortness of breath, life threatening emergency, suicidal or homicidal thoughts you must seek medical attention immediately by calling 911 or calling your MD immediately  if symptoms less severe.  You Must read complete instructions/literature along with all the possible adverse reactions/side effects for all the Medicines you take and that have been prescribed to you. Take any new Medicines after you have completely understood and accpet all the possible adverse reactions/side effects.   Please note  You were cared for by a hospitalist during your hospital stay. If you have any questions about your discharge medications or the care you received while you were in the hospital after you are discharged, you can call the unit and asked to speak with the hospitalist on call if the hospitalist that took care of you is not available. Once you are discharged, your primary care physician will handle any further medical issues. Please note that NO REFILLS for any discharge medications will be authorized once you are discharged, as it is imperative that you return to your primary care physician (or establish a relationship with a primary care physician if you do not have one) for your aftercare needs so that they can reassess your need for medications and monitor your lab values.    On the day of Discharge:  VITAL SIGNS:   PHYSICAL EXAMINATION:  [facility] DATA REVIEW:   CBC No results for input(s): WBC, HGB, HCT, PLT in the last 168 hours.  Chemistries  No results for input(s): NA, K, CL, CO2, GLUCOSE, BUN, CREATININE, CALCIUM,  MG, AST, ALT, ALKPHOS, BILITOT in the last 168 hours.  Invalid input(s): GFRCGP   Microbiology Results  Results for orders placed or performed during the hospital encounter of 28-Mar-2017  MRSA PCR Screening     Status: None   Collection Time: 05/13/2016  2:30 AM  Result Value Ref Range Status   MRSA by PCR NEGATIVE NEGATIVE Final    Comment:        The GeneXpert MRSA Assay (FDA approved for NASAL specimens only), is one component of a comprehensive MRSA colonization surveillance program. It is not intended to diagnose MRSA infection nor to guide or monitor treatment for MRSA infections.     RADIOLOGY:  No results found.   Management plans discussed with the patient, family and they are in agreement.  CODE STATUS:  Code Status History    Date Active Date Inactive Code Status Order ID Comments User Context   05/05/2016  2:37 AM 05/17/2016 10:44 AM DNR [id number]  Tonye RoyaltyAlexis Tyress Loden, DO Inpatient  05/24/2016  1:51 AM 05-24-2016  2:37 AM Full Code [id number]  Tonye Royalty, DO Inpatient   24-May-2016  1:20 AM 24-May-2016  1:51 AM DNR [id number]  Tonye Royalty, DO ED    Questions for Most Recent Historical Code Status (Order [id number])    Question Answer Comment   In the event of cardiac or respiratory ARREST Do not call a "code blue"    In the event of cardiac or respiratory ARREST Do not perform Intubation, CPR, defibrillation or ACLS    In the event of cardiac or respiratory ARREST Use medication by any route, position, wound care, and other measures to relive pain and suffering. May use oxygen, suction and manual treatment of airway obstruction as needed for comfort.       Tonye Royalty M.D on 05/29/2016 at 8:15 PM  Between 7am to 6pm - Pager - [phone]  After 6pm go to [email or web address] - Scientist, research (life sciences) [facility]  Office  [phone]  CC: Primary care physician; No PCP Per Patient   Note: This dictation was prepared with Dragon  dictation along with smaller phrase technology. Any transcriptional errors that result from this process are unintentional.

## 2018-09-11 IMAGING — CT CT HEAD W/O CM
3 series · 14 of 44 positions shown, 16 images · non-contrast
Comparison: Head CT dated 06/20/2012

CLINICAL DATA: [AGE] female with fall and trauma to the head.

EXAM:
CT HEAD WITHOUT CONTRAST
TECHNIQUE: Contiguous axial images were obtained from the base of the skull
through the vertex without intravenous contrast.

[Series 2: head wo · axial · 0.47mm/px · z∈[-110,-0]mm · 8 of 27 slices shown, 10 images]
[im 3/27  brain]
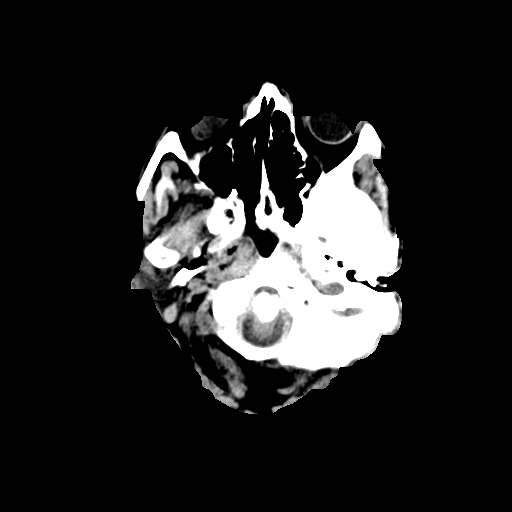
[im 3/27  bone]
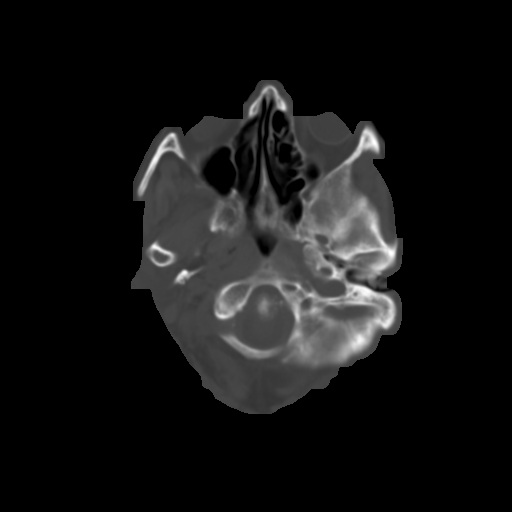
[im 6/27  brain]
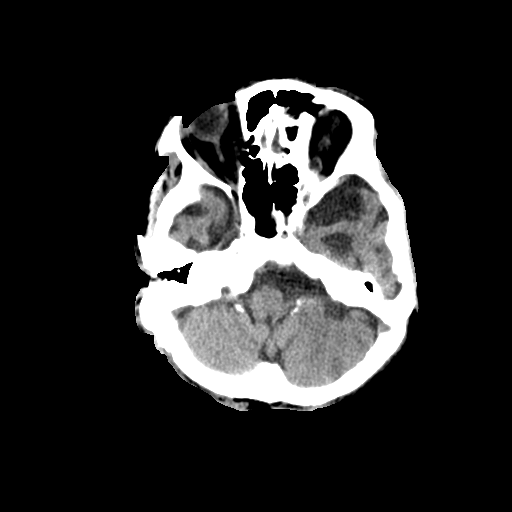
[im 9/27  brain]
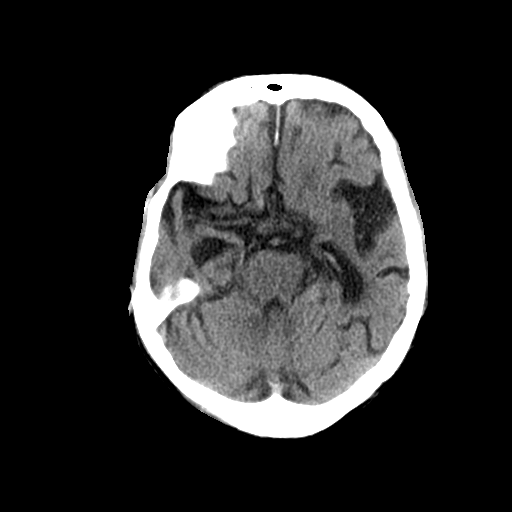
[im 12/27  brain]
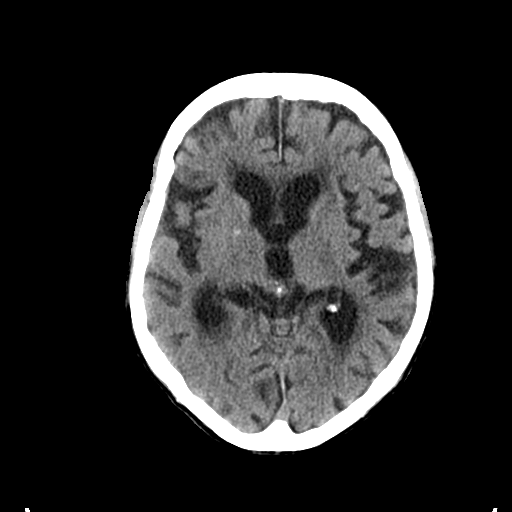
[im 16/27  brain]
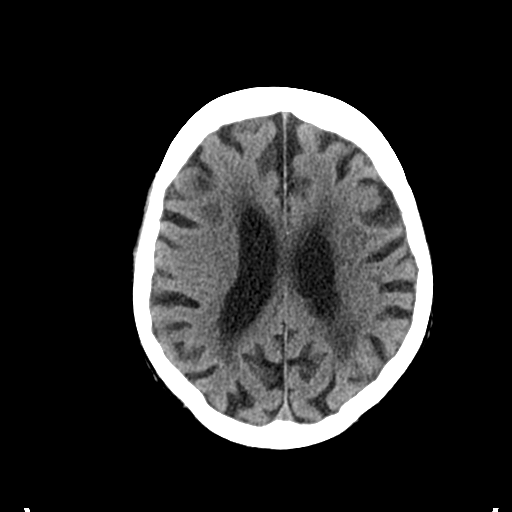
[im 16/27  bone]
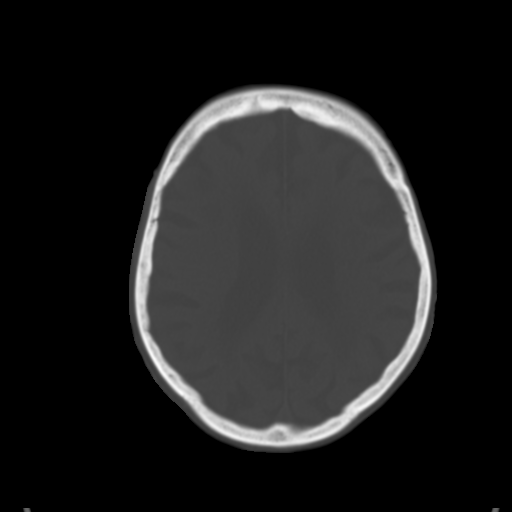
[im 19/27  brain]
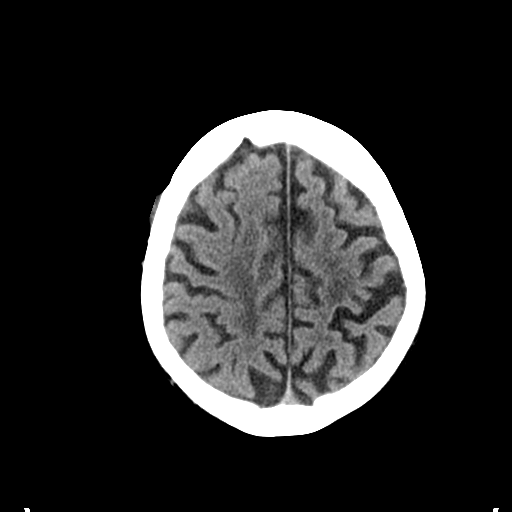
[im 22/27  brain]
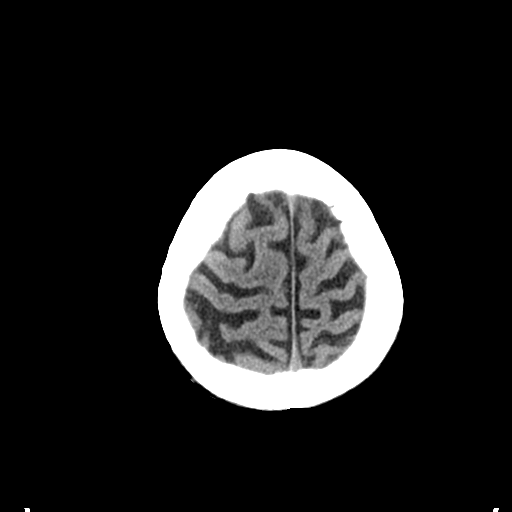
[im 25/27  brain]
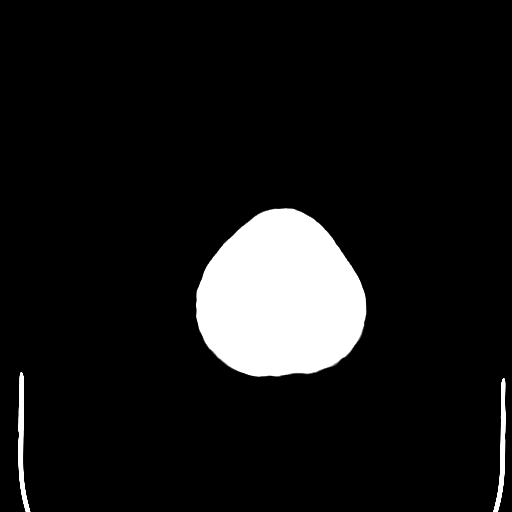

[Series 4: coronal soft tissue · coronal · 0.26mm/px · 3 of 59 slices shown]
[im 20/59  brain]
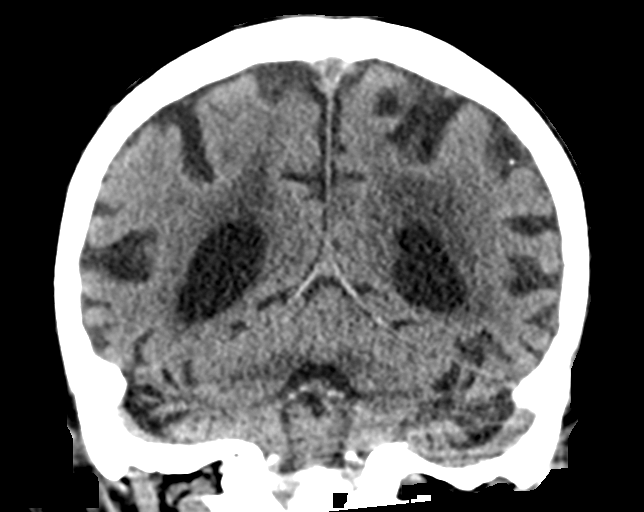
[im 26/59  brain]
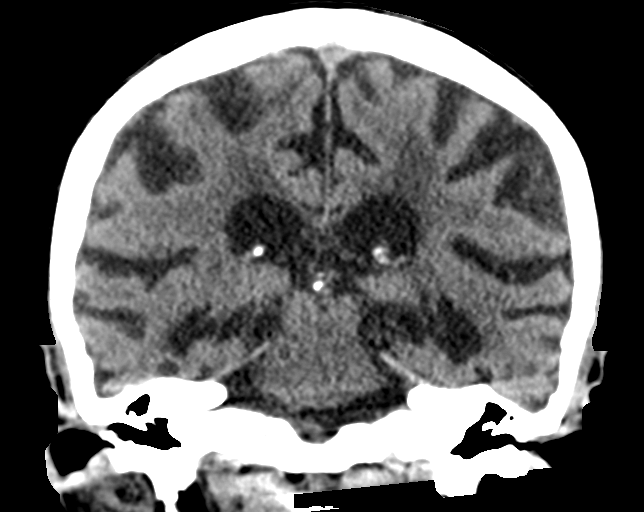
[im 33/59  brain]
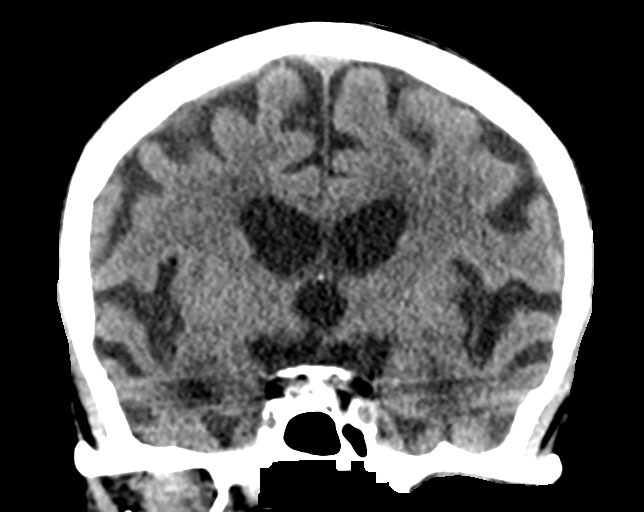

[Series 5: sagittal soft tissue · sagittal · 0.25mm/px · 3 of 48 slices shown]
[im 16/48  brain]
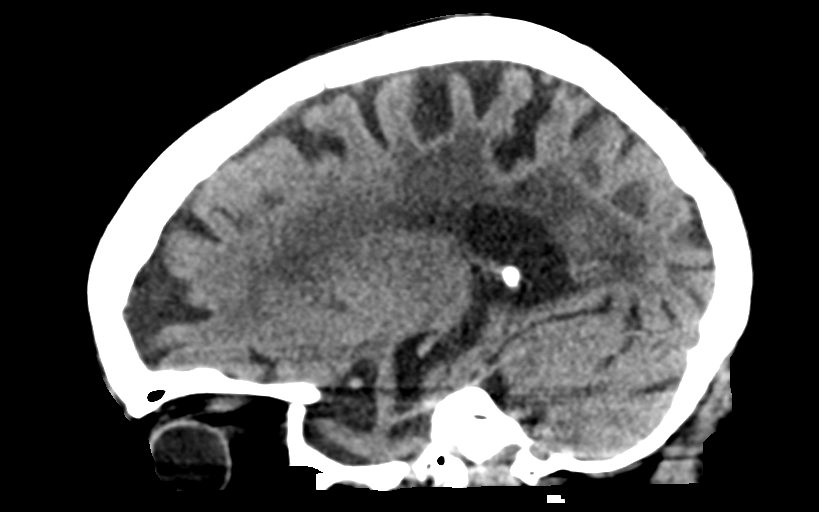
[im 24/48  brain]
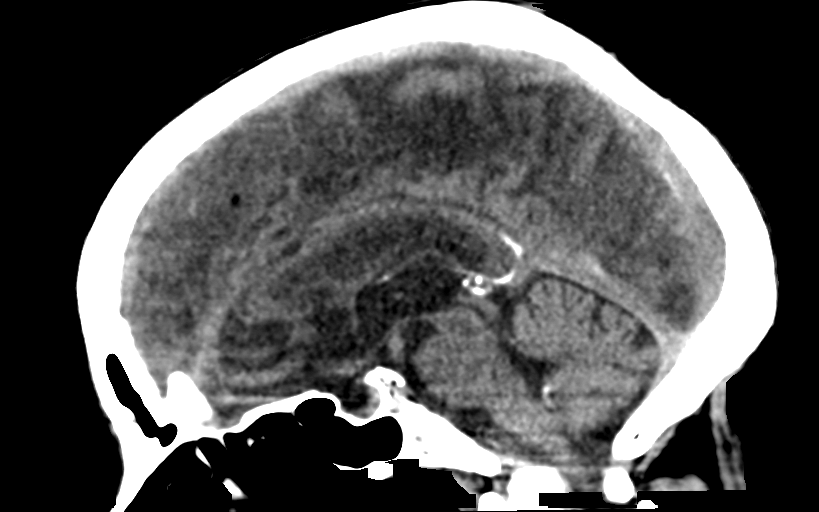
[im 32/48  brain]
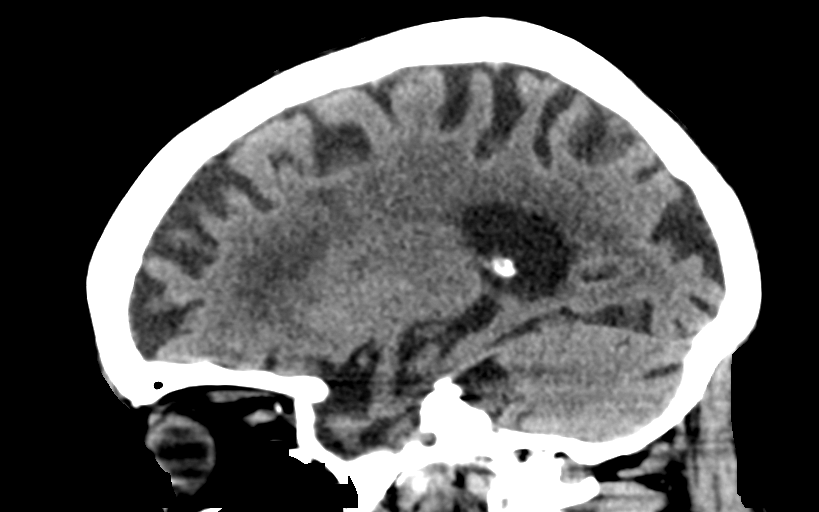

[14 of 44 positions shown; findings below may reference images not displayed]

FINDINGS: Brain: There is moderate age-related atrophy and chronic
microvascular ischemic changes. There is no acute intracranial
hemorrhage. No mass effect or midline shift noted. No extra-axial
fluid collections.

Vascular: No hyperdense vessel or unexpected calcification.

Skull: Normal. Negative for fracture or focal lesion.

Sinuses/Orbits: The visualized paranasal sinuses are clear. There is
opacification of the right mastoid air cells. No air-fluid level.
The left mastoid air cells are clear. Cerumen noted in the external
auditory canal bilaterally.

Other: A 15 x 4 mm soft tissue density over the right temporal
calvarium may represent a small hematoma versus a skin lesion.
Correlation with clinical exam recommended.
IMPRESSION: No acute intracranial hemorrhage.

Moderate age-related atrophy and chronic microvascular ischemic
changes.

Focal skin prominence over the right temporal calvarium. Correlation
with clinical exam recommended.
